# Patient Record
Sex: Female | Born: 1990 | Race: White | Hispanic: No | Marital: Single | Smoking: Current every day smoker
Health system: Southern US, Community
[De-identification: ages and names within clinical notes are randomized; demographics above are authoritative.]

## PROBLEM LIST (undated history)

## (undated) ENCOUNTER — Inpatient Hospital Stay (HOSPITAL_COMMUNITY): Payer: Self-pay

## (undated) DIAGNOSIS — M549 Dorsalgia, unspecified: Secondary | ICD-10-CM

## (undated) DIAGNOSIS — D649 Anemia, unspecified: Secondary | ICD-10-CM

## (undated) DIAGNOSIS — F419 Anxiety disorder, unspecified: Secondary | ICD-10-CM

## (undated) DIAGNOSIS — F329 Major depressive disorder, single episode, unspecified: Secondary | ICD-10-CM

## (undated) DIAGNOSIS — Z141 Cystic fibrosis carrier: Secondary | ICD-10-CM

## (undated) DIAGNOSIS — O9989 Other specified diseases and conditions complicating pregnancy, childbirth and the puerperium: Secondary | ICD-10-CM

## (undated) DIAGNOSIS — F32A Depression, unspecified: Secondary | ICD-10-CM

## (undated) HISTORY — DX: Anemia, unspecified: D64.9

## (undated) HISTORY — PX: APPENDECTOMY: SHX54

## (undated) HISTORY — DX: Other specified diseases and conditions complicating pregnancy, childbirth and the puerperium: O99.89

## (undated) HISTORY — DX: Dorsalgia, unspecified: M54.9

---
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
## 2011-03-29 ENCOUNTER — Emergency Department (HOSPITAL_COMMUNITY): Payer: Medicaid Other

## 2011-03-29 ENCOUNTER — Encounter (HOSPITAL_COMMUNITY): Payer: Self-pay

## 2011-03-29 ENCOUNTER — Emergency Department (HOSPITAL_COMMUNITY)
Admission: EM | Admit: 2011-03-29 | Discharge: 2011-03-29 | Disposition: A | Discharge status: Home / Self Care | Payer: Medicaid Other | Attending: Emergency Medicine | Admitting: Emergency Medicine

## 2011-03-29 DIAGNOSIS — A499 Bacterial infection, unspecified: Secondary | ICD-10-CM | POA: Insufficient documentation

## 2011-03-29 DIAGNOSIS — N939 Abnormal uterine and vaginal bleeding, unspecified: Secondary | ICD-10-CM

## 2011-03-29 DIAGNOSIS — F17200 Nicotine dependence, unspecified, uncomplicated: Secondary | ICD-10-CM | POA: Insufficient documentation

## 2011-03-29 DIAGNOSIS — R51 Headache: Secondary | ICD-10-CM | POA: Insufficient documentation

## 2011-03-29 DIAGNOSIS — N760 Acute vaginitis: Secondary | ICD-10-CM | POA: Insufficient documentation

## 2011-03-29 DIAGNOSIS — B9689 Other specified bacterial agents as the cause of diseases classified elsewhere: Secondary | ICD-10-CM | POA: Insufficient documentation

## 2011-03-29 DIAGNOSIS — F3289 Other specified depressive episodes: Secondary | ICD-10-CM | POA: Insufficient documentation

## 2011-03-29 DIAGNOSIS — F329 Major depressive disorder, single episode, unspecified: Secondary | ICD-10-CM | POA: Insufficient documentation

## 2011-03-29 DIAGNOSIS — N898 Other specified noninflammatory disorders of vagina: Secondary | ICD-10-CM | POA: Insufficient documentation

## 2011-03-29 LAB — DIFFERENTIAL
Basophils Absolute: 0 10*3/uL (ref 0.0–0.1)
Basophils Relative: 0 % (ref 0–1)
Eosinophils Absolute: 0.3 10*3/uL (ref 0.0–0.7)
Neutro Abs: 4.2 10*3/uL (ref 1.7–7.7)
Neutrophils Relative %: 55 % (ref 43–77)

## 2011-03-29 LAB — URINALYSIS, ROUTINE W REFLEX MICROSCOPIC
Glucose, UA: NEGATIVE mg/dL
Ketones, ur: NEGATIVE mg/dL
Leukocytes, UA: NEGATIVE
Protein, ur: NEGATIVE mg/dL
pH: 5.5 (ref 5.0–8.0)

## 2011-03-29 LAB — WET PREP, GENITAL
Trich, Wet Prep: NONE SEEN
Yeast Wet Prep HPF POC: NONE SEEN

## 2011-03-29 LAB — CBC
Hemoglobin: 13.4 g/dL (ref 12.0–15.0)
MCH: 31.3 pg (ref 26.0–34.0)
MCHC: 33.4 g/dL (ref 30.0–36.0)
Platelets: 259 10*3/uL (ref 150–400)
RDW: 12.6 % (ref 11.5–15.5)

## 2011-03-29 LAB — COMPREHENSIVE METABOLIC PANEL
AST: 15 U/L (ref 0–37)
Albumin: 4.3 g/dL (ref 3.5–5.2)
Alkaline Phosphatase: 85 U/L (ref 39–117)
BUN: 7 mg/dL (ref 6–23)
Chloride: 105 mEq/L (ref 96–112)
Potassium: 4.2 mEq/L (ref 3.5–5.1)
Sodium: 139 mEq/L (ref 135–145)
Total Bilirubin: 0.2 mg/dL — ABNORMAL LOW (ref 0.3–1.2)
Total Protein: 7.8 g/dL (ref 6.0–8.3)

## 2011-03-29 LAB — URINE MICROSCOPIC-ADD ON

## 2011-03-29 LAB — POCT PREGNANCY, URINE: Preg Test, Ur: NEGATIVE

## 2011-03-29 MED ORDER — NAPROXEN SODIUM 550 MG PO TABS: 550.0000 mg | ORAL_TABLET | Freq: Two times a day (BID) | ORAL | Status: DC

## 2011-03-29 MED ORDER — METRONIDAZOLE 500 MG PO TABS: 500.0000 mg | ORAL_TABLET | Freq: Two times a day (BID) | ORAL | Status: AC

## 2011-03-29 NOTE — ED Provider Notes (Signed)
Medical screening examination/treatment/procedure(s) were performed by non-physician practitioner and as supervising physician I was immediately available for consultation/collaboration.  Champagne Paletta S. Douglas Rooks, MD 03/29/11 2309 

## 2011-03-29 NOTE — ED Notes (Signed)
Pt c/o llq pain and diarrhea x 1 week.  Reports is 5 months postpartum.   Denies urinary symptoms, reports bled  From Nov 17th until last week.  Reports has had whitish colored discharge x 2 days.

## 2011-03-29 NOTE — ED Notes (Signed)
Returned from U/S

## 2011-03-29 NOTE — ED Provider Notes (Signed)
History     CSN: [id number]  Arrival date & time 03/29/11  1444   First MD Initiated Contact with Patient 03/29/11 1506      Chief Complaint  Patient presents with  . Abdominal Pain   HPI Jeanne Hernandez is a 21 y.o. female who presents to the ED for abdominal pain. The pain is located in the left lower quadrant and started one week ago. She is 5 months postpartum. Stopped bleeding one week ago.  She denies UTI symptoms. White vaginal discharge x 2 days. Denies nausea or vomiting but has had diarrhea past week with 2 stools per day that are loose or watery, brown. Depo Provera for birth control. Last sexual intercourse 03/14/11 with out pain or bleeding. Current sex partner x 4 years. The history was provided by the patient.   Past Medical History  Diagnosis Date  . Cystic fibrosis gene carrier   . Depression     Past Surgical History  Procedure Date  . Appendectomy     No family history on file.  History  Substance Use Topics  . Smoking status: Current Everyday Smoker -- 2.0 packs/day    Types: Cigarettes  . Smokeless tobacco: Not on file  . Alcohol Use: No    OB History    Grav Para Term Preterm Abortions TAB SAB Ect Mult Living   2 2 2       1       Review of Systems  Constitutional: Positive for fatigue. Negative for fever, chills and diaphoresis.  HENT: Negative for ear pain, congestion, sore throat, facial swelling, neck pain, neck stiffness, dental problem and sinus pressure.   Eyes: Negative for photophobia, pain and discharge.  Respiratory: Positive for wheezing. Negative for cough and chest tightness.   Cardiovascular: Negative.   Gastrointestinal: Positive for nausea, abdominal pain and diarrhea. Negative for vomiting, constipation and abdominal distention.  Genitourinary: Positive for vaginal discharge. Negative for dysuria, frequency, flank pain and difficulty urinating.  Musculoskeletal: Negative for myalgias, back pain and gait problem.  Skin: Negative for  color change and rash.  Neurological: Positive for headaches. Negative for dizziness, speech difficulty, weakness, light-headedness and numbness.  Psychiatric/Behavioral: Negative for confusion and agitation. The patient is not nervous/anxious.        Depression    Allergies  Review of patient's allergies indicates no known allergies.  Home Medications   Current Outpatient Rx  Name Route Sig Dispense Refill  . ESCITALOPRAM OXALATE 10 MG PO TABS Oral Take 10 mg by mouth at bedtime.     Marland Kitchen PRENATAL PLUS 27-1 MG PO TABS Oral Take 1 tablet by mouth daily.        BP 114/63  Pulse 78  Temp(Src) 98.5 F (36.9 C) (Oral)  Resp 20  Ht 5\' 2"  (1.575 m)  Wt 107 lb (48.535 kg)  BMI 19.57 kg/m2  SpO2 100%  Breastfeeding? No  Physical Exam  Nursing note and vitals reviewed. Constitutional: She is oriented to person, place, and time. She appears well-developed and well-nourished.  HENT:  Head: Normocephalic.  Eyes: EOM are normal.  Neck: Neck supple.  Cardiovascular: Normal rate.   Pulmonary/Chest: Effort normal.  Abdominal: Soft. There is no tenderness.  Genitourinary:       External genitalia without lesions. Scant blood vaginal vault. Cervix inflamed, mild CMT, left adnexal tenderness. Rectal exam, tone- normal, soft brown stool palpated, non tender on exam.  Musculoskeletal: Normal range of motion.  Neurological: She is alert and oriented to person,  place, and time. No cranial nerve deficit.  Skin: Skin is warm and dry.  Psychiatric: She has a normal mood and affect. Her behavior is normal. Judgment and thought content normal.   Results for orders placed during the hospital encounter of 03/29/11 (from the past 24 hour(s))  WET PREP, GENITAL     Status: Abnormal   Collection Time   03/29/11  3:25 PM      Component Value Range   Yeast, Wet Prep NONE SEEN  NONE SEEN    Trich, Wet Prep NONE SEEN  NONE SEEN    Clue Cells, Wet Prep MODERATE (*) NONE SEEN    WBC, Wet Prep HPF POC MANY  (*) NONE SEEN   URINALYSIS, ROUTINE W REFLEX MICROSCOPIC     Status: Abnormal   Collection Time   03/29/11  3:27 PM      Component Value Range   Color, Urine YELLOW  YELLOW    APPearance CLEAR  CLEAR    Specific Gravity, Urine >1.030 (*) 1.005 - 1.030    pH 5.5  5.0 - 8.0    Glucose, UA NEGATIVE  NEGATIVE (mg/dL)   Hgb urine dipstick TRACE (*) NEGATIVE    Bilirubin Urine NEGATIVE  NEGATIVE    Ketones, ur NEGATIVE  NEGATIVE (mg/dL)   Protein, ur NEGATIVE  NEGATIVE (mg/dL)   Urobilinogen, UA 0.2  0.0 - 1.0 (mg/dL)   Nitrite NEGATIVE  NEGATIVE    Leukocytes, UA NEGATIVE  NEGATIVE   URINE MICROSCOPIC-ADD ON     Status: Normal   Collection Time   03/29/11  3:27 PM      Component Value Range   Squamous Epithelial / LPF RARE  RARE    WBC, UA 0-2  <3 (WBC/hpf)   RBC / HPF 0-2  <3 (RBC/hpf)   Bacteria, UA RARE  RARE    Urine-Other MUCOUS PRESENT    POCT PREGNANCY, URINE     Status: Normal   Collection Time   03/29/11  3:30 PM      Component Value Range   Preg Test, Ur NEGATIVE    CBC     Status: Normal   Collection Time   03/29/11  3:38 PM      Component Value Range   WBC 7.7  4.0 - 10.5 (K/uL)   RBC 4.28  3.87 - 5.11 (MIL/uL)   Hemoglobin 13.4  12.0 - 15.0 (g/dL)   HCT 16.1  09.6 - 04.5 (%)   MCV 93.7  78.0 - 100.0 (fL)   MCH 31.3  26.0 - 34.0 (pg)   MCHC 33.4  30.0 - 36.0 (g/dL)   RDW 40.9  81.1 - 91.4 (%)   Platelets 259  150 - 400 (K/uL)  DIFFERENTIAL     Status: Normal   Collection Time   03/29/11  3:38 PM      Component Value Range   Neutrophils Relative 55  43 - 77 (%)   Neutro Abs 4.2  1.7 - 7.7 (K/uL)   Lymphocytes Relative 35  12 - 46 (%)   Lymphs Abs 2.7  0.7 - 4.0 (K/uL)   Monocytes Relative 7  3 - 12 (%)   Monocytes Absolute 0.5  0.1 - 1.0 (K/uL)   Eosinophils Relative 3  0 - 5 (%)   Eosinophils Absolute 0.3  0.0 - 0.7 (K/uL)   Basophils Relative 0  0 - 1 (%)   Basophils Absolute 0.0  0.0 - 0.1 (K/uL)   US Transvaginal Non-ob  03/29/2011  *  RADIOLOGY REPORT*   Clinical Data: Left adnexal pain  TRANSABDOMINAL AND TRANSVAGINAL ULTRASOUND OF PELVIS Technique:  Both transabdominal and transvaginal ultrasound examinations of the pelvis were performed. Transabdominal technique was performed for global imaging of the pelvis including uterus, ovaries, adnexal regions, and pelvic cul-de-sac.  Comparison: None.   It was necessary to proceed with endovaginal exam following the transabdominal exam to visualize the endometrium.  Findings:  Uterus: Normal in size and appearance, measuring 7.2 x 3.5 x 5.0 cm.  Endometrium: Normal in thickness and appearance, measuring 3 mm.  Right ovary:  Measures 4.4 x 2.2 x 2.4 cm and is notable for a 1.8 x 2.6 x 1.7 cm cyst.  Left ovary: Normal appearance/no adnexal mass, measuring 2.8 x 1.0 x 1.4 cm.  Other findings: Small volume pelvic ascites.  IMPRESSION: Normal study.  No evidence of pelvic mass or other significant abnormality.  Original Report Authenticated By: Charline Bills, M.D.   US Pelvis Complete  03/29/2011  *RADIOLOGY REPORT*  Clinical Data: Left adnexal pain  TRANSABDOMINAL AND TRANSVAGINAL ULTRASOUND OF PELVIS Technique:  Both transabdominal and transvaginal ultrasound examinations of the pelvis were performed. Transabdominal technique was performed for global imaging of the pelvis including uterus, ovaries, adnexal regions, and pelvic cul-de-sac.  Comparison: None.   It was necessary to proceed with endovaginal exam following the transabdominal exam to visualize the endometrium.  Findings:  Uterus: Normal in size and appearance, measuring 7.2 x 3.5 x 5.0 cm.  Endometrium: Normal in thickness and appearance, measuring 3 mm.  Right ovary:  Measures 4.4 x 2.2 x 2.4 cm and is notable for a 1.8 x 2.6 x 1.7 cm cyst.  Left ovary: Normal appearance/no adnexal mass, measuring 2.8 x 1.0 x 1.4 cm.  Other findings: Small volume pelvic ascites.  IMPRESSION: Normal study.  No evidence of pelvic mass or other significant abnormality.   Original Report Authenticated By: Charline Bills, M.D.   Assessment: Bacterial vaginosis   Abnormal vaginal bleeding probably due to Depo Provera  Plan:  Rx Flagyl   Follow up with [facility]   Return as needed  ED Course  Procedures   MDM          Kerrie Buffalo, NP 03/29/11 1639

## 2011-03-29 NOTE — ED Notes (Signed)
Patient transported to Ultrasound 

## 2011-03-29 NOTE — ED Notes (Signed)
Pain LLQ and suprapubic area for 2 days, Has been having diarrhea also,  White vag d/c.

## 2011-03-30 LAB — GC/CHLAMYDIA PROBE AMP, GENITAL
Chlamydia, DNA Probe: NEGATIVE
GC Probe Amp, Genital: NEGATIVE

## 2011-05-03 ENCOUNTER — Emergency Department (HOSPITAL_COMMUNITY)
Admission: EM | Admit: 2011-05-03 | Discharge: 2011-05-04 | Disposition: A | Discharge status: Other Acute Inpatient Hospital | Payer: 59 | Source: Home / Self Care | Attending: Emergency Medicine | Admitting: Emergency Medicine

## 2011-05-03 ENCOUNTER — Encounter (HOSPITAL_COMMUNITY): Payer: Self-pay | Admitting: Emergency Medicine

## 2011-05-03 DIAGNOSIS — R45851 Suicidal ideations: Secondary | ICD-10-CM

## 2011-05-03 DIAGNOSIS — F329 Major depressive disorder, single episode, unspecified: Secondary | ICD-10-CM | POA: Insufficient documentation

## 2011-05-03 DIAGNOSIS — F3289 Other specified depressive episodes: Secondary | ICD-10-CM | POA: Insufficient documentation

## 2011-05-03 DIAGNOSIS — F411 Generalized anxiety disorder: Secondary | ICD-10-CM | POA: Insufficient documentation

## 2011-05-03 DIAGNOSIS — Z141 Cystic fibrosis carrier: Secondary | ICD-10-CM | POA: Insufficient documentation

## 2011-05-03 HISTORY — DX: Anxiety disorder, unspecified: F41.9

## 2011-05-03 LAB — RAPID URINE DRUG SCREEN, HOSP PERFORMED
Amphetamines: NOT DETECTED
Barbiturates: NOT DETECTED
Benzodiazepines: NOT DETECTED
Cocaine: NOT DETECTED

## 2011-05-03 LAB — CBC
HCT: 38.4 % (ref 36.0–46.0)
MCHC: 34.4 g/dL (ref 30.0–36.0)
MCV: 94.6 fL (ref 78.0–100.0)
Platelets: 273 10*3/uL (ref 150–400)
RDW: 13 % (ref 11.5–15.5)

## 2011-05-03 LAB — URINALYSIS, ROUTINE W REFLEX MICROSCOPIC
Glucose, UA: NEGATIVE mg/dL
Ketones, ur: NEGATIVE mg/dL
pH: 5.5 (ref 5.0–8.0)

## 2011-05-03 LAB — BASIC METABOLIC PANEL
Calcium: 9.8 mg/dL (ref 8.4–10.5)
Creatinine, Ser: 0.8 mg/dL (ref 0.50–1.10)
GFR calc Af Amer: >90 mL/min (ref >90)
GFR calc non Af Amer: >90 mL/min (ref >90)

## 2011-05-03 LAB — DIFFERENTIAL
Basophils Absolute: 0 10*3/uL (ref 0.0–0.1)
Basophils Relative: 0 % (ref 0–1)
Eosinophils Relative: 2 % (ref 0–5)
Monocytes Absolute: 0.6 10*3/uL (ref 0.1–1.0)
Neutro Abs: 5.2 10*3/uL (ref 1.7–7.7)

## 2011-05-03 LAB — POCT PREGNANCY, URINE: Preg Test, Ur: NEGATIVE

## 2011-05-03 NOTE — ED Provider Notes (Signed)
History     CSN: [id number]  Arrival date & time 05/03/11  1916   First MD Initiated Contact with Patient 05/03/11 2009        (Consider location/radiation/quality/duration/timing/severity/associated sxs/prior treatment) The history is provided by the patient.   patient presents with suicidal ideations without definitive plan. No syncope stressed due to home situation. Denies any intentional ingestions. No auditory or visual hallucinations. No prior suicide attempt in the past. History of depression and anxiety and doesn't medications for this. No extra medications taken prior to arrival and stress makes them worse  Past Medical History  Diagnosis Date  . Cystic fibrosis gene carrier   . Depression   . Anxiety     Past Surgical History  Procedure Date  . Appendectomy     No family history on file.  History  Substance Use Topics  . Smoking status: Current Everyday Smoker -- 2.0 packs/day    Types: Cigarettes  . Smokeless tobacco: Not on file  . Alcohol Use: No    OB History    Grav Para Term Preterm Abortions TAB SAB Ect Mult Living   2 2 2       1       Review of Systems  All other systems reviewed and are negative.    Allergies  Review of patient's allergies indicates no known allergies.  Home Medications   Current Outpatient Rx  Name Route Sig Dispense Refill  . ARIPIPRAZOLE 5 MG PO TABS Oral Take 5 mg by mouth daily.    Marland Kitchen ESCITALOPRAM OXALATE 20 MG PO TABS Oral Take 20 mg by mouth at bedtime.    Marland Kitchen NAPROXEN SODIUM 550 MG PO TABS Oral Take 1 tablet (550 mg total) by mouth 2 (two) times daily with a meal. 15 tablet 0    BP 129/79  Pulse 71  Temp(Src) 98.3 F (36.8 C) (Oral)  Resp 18  Ht 5' 2.5" (1.588 m)  Wt 110 lb (49.896 kg)  BMI 19.80 kg/m2  SpO2 98%  Physical Exam  Nursing note and vitals reviewed. Constitutional: She is oriented to person, place, and time. Vital signs are normal. She appears well-developed and well-nourished.  Non-toxic  appearance. No distress.  HENT:  Head: Normocephalic and atraumatic.  Eyes: Conjunctivae, EOM and lids are normal. Pupils are equal, round, and reactive to light.  Neck: Normal range of motion. Neck supple. No tracheal deviation present. No mass present.  Cardiovascular: Normal rate, regular rhythm and normal heart sounds.  Exam reveals no gallop.   No murmur heard. Pulmonary/Chest: Effort normal and breath sounds normal. No stridor. No respiratory distress. She has no decreased breath sounds. She has no wheezes. She has no rhonchi. She has no rales.  Abdominal: Soft. Normal appearance and bowel sounds are normal. She exhibits no distension. There is no tenderness. There is no rebound and no CVA tenderness.  Musculoskeletal: Normal range of motion. She exhibits no edema and no tenderness.  Neurological: She is alert and oriented to person, place, and time. She has normal strength. No cranial nerve deficit or sensory deficit. GCS eye subscore is 4. GCS verbal subscore is 5. GCS motor subscore is 6.  Skin: Skin is warm and dry. No abrasion and no rash noted.  Psychiatric: Her speech is normal and behavior is normal. Her affect is blunt. Thought content is not paranoid and not delusional. She expresses suicidal ideation. She expresses no suicidal plans.    ED Course  Procedures (including critical care time)  Labs Reviewed  URINALYSIS, ROUTINE W REFLEX MICROSCOPIC  ETHANOL  URINE RAPID DRUG SCREEN (HOSP PERFORMED)  CBC  DIFFERENTIAL  BASIC METABOLIC PANEL   No results found.   No diagnosis found.    MDM  Spoke with act team, and they will come to see and dispo        Toy Baker, MD 05/03/11 2029

## 2011-05-03 NOTE — ED Notes (Signed)
Returned from consult room - given warm blankets.

## 2011-05-03 NOTE — ED Notes (Signed)
Patient with c/o SI that started several hours ago. Patient reports history of depression. Reports stressor at home. Calm/cooperative at present. Tearful in triage.
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
## 2011-05-03 NOTE — ED Notes (Signed)
ACT Consultant - Samson Frederic talking with patient

## 2011-05-04 ENCOUNTER — Encounter (HOSPITAL_COMMUNITY): Payer: Self-pay | Admitting: *Deleted

## 2011-05-04 ENCOUNTER — Inpatient Hospital Stay (HOSPITAL_COMMUNITY)
Admission: AD | Admit: 2011-05-04 | Discharge: 2011-05-05 | DRG: 885 | Disposition: A | Discharge status: Home / Self Care | Payer: 59 | Source: Ambulatory Visit | Attending: Psychiatry | Admitting: Psychiatry

## 2011-05-04 DIAGNOSIS — F3130 Bipolar disorder, current episode depressed, mild or moderate severity, unspecified: Principal | ICD-10-CM

## 2011-05-04 DIAGNOSIS — F411 Generalized anxiety disorder: Secondary | ICD-10-CM

## 2011-05-04 DIAGNOSIS — R45851 Suicidal ideations: Secondary | ICD-10-CM

## 2011-05-04 DIAGNOSIS — F17200 Nicotine dependence, unspecified, uncomplicated: Secondary | ICD-10-CM

## 2011-05-04 DIAGNOSIS — Z141 Cystic fibrosis carrier: Secondary | ICD-10-CM

## 2011-05-04 LAB — URINALYSIS, ROUTINE W REFLEX MICROSCOPIC
Bilirubin Urine: NEGATIVE
Glucose, UA: NEGATIVE mg/dL
Nitrite: NEGATIVE
Specific Gravity, Urine: 1.025 (ref 1.005–1.030)
pH: 5.5 (ref 5.0–8.0)

## 2011-05-04 LAB — URINE MICROSCOPIC-ADD ON

## 2011-05-04 MED ORDER — MAGNESIUM HYDROXIDE 400 MG/5ML PO SUSP: 30.0000 mL | Freq: Every day | ORAL | Status: DC | PRN

## 2011-05-04 MED ORDER — ESCITALOPRAM OXALATE 20 MG PO TABS
20.0000 mg | ORAL_TABLET | Freq: Every day | ORAL | Status: DC
  Administered 2011-05-04: 20 mg via ORAL
  Filled 2011-05-04 (×2): qty 1

## 2011-05-04 MED ORDER — IBUPROFEN 600 MG PO TABS
600.0000 mg | ORAL_TABLET | Freq: Four times a day (QID) | ORAL | Status: DC | PRN
  Administered 2011-05-04 – 2011-05-05 (×4): 600 mg via ORAL
  Filled 2011-05-04 (×4): qty 1

## 2011-05-04 MED ORDER — PROPRANOLOL HCL ER 60 MG PO CP24
60.0000 mg | ORAL_CAPSULE | Freq: Every day | ORAL | Status: DC
  Administered 2011-05-04 – 2011-05-05 (×2): 60 mg via ORAL
  Filled 2011-05-04 (×7): qty 1

## 2011-05-04 MED ORDER — VENLAFAXINE HCL ER 37.5 MG PO CP24
37.5000 mg | ORAL_CAPSULE | Freq: Every day | ORAL | Status: DC
  Administered 2011-05-05: 37.5 mg via ORAL
  Filled 2011-05-04 (×2): qty 1

## 2011-05-04 MED ORDER — NICOTINE 21 MG/24HR TD PT24
21.0000 mg | MEDICATED_PATCH | Freq: Every day | TRANSDERMAL | Status: DC
  Administered 2011-05-04 – 2011-05-05 (×3): 21 mg via TRANSDERMAL
  Filled 2011-05-04 (×3): qty 1

## 2011-05-04 MED ORDER — LAMOTRIGINE 5 MG PO CHEW: 25.0000 mg | CHEWABLE_TABLET | Freq: Every day | ORAL | Status: DC

## 2011-05-04 MED ORDER — BUPROPION HCL ER (SR) 100 MG PO TB12: 100.0000 mg | ORAL_TABLET | Freq: Two times a day (BID) | ORAL | Status: DC

## 2011-05-04 MED ORDER — LAMOTRIGINE 25 MG PO TABS
25.0000 mg | ORAL_TABLET | Freq: Every day | ORAL | Status: DC
  Administered 2011-05-04 – 2011-05-05 (×2): 25 mg via ORAL
  Filled 2011-05-04 (×4): qty 1

## 2011-05-04 MED ORDER — ALUM & MAG HYDROXIDE-SIMETH 200-200-20 MG/5ML PO SUSP: 30.0000 mL | ORAL | Status: DC | PRN

## 2011-05-04 MED ORDER — ACETAMINOPHEN 325 MG PO TABS
650.0000 mg | ORAL_TABLET | Freq: Four times a day (QID) | ORAL | Status: DC | PRN
  Administered 2011-05-04: 650 mg via ORAL

## 2011-05-04 NOTE — [facility] Suicide Risk Assessment (Signed)
Suicide Risk Assessment  Discharge Assessment     Demographic factors:  Assessment Details Time of Assessment: Admission Information Obtained From: Patient Current Mental Status:  Current Mental Status:  (denied SI & HI, contracts for safety, appropriate) Risk Reduction Factors:  Risk Reduction Factors: Responsible for children under 20 years of age;Sense of responsibility to family;Living with another person, especially a relative;Positive social support  CLINICAL FACTORS:   Severe Anxiety and/or Agitation Bipolar Disorder:   Bipolar II Depression:   Anhedonia Hopelessness Previous Psychiatric Diagnoses and Treatments  COGNITIVE FEATURES THAT CONTRIBUTE TO RISK:  Thought constriction (tunnel vision)    SUICIDE RISK:   Moderate:  Frequent suicidal ideation with limited intensity, and duration, some specificity in terms of plans, no associated intent, good self-control, limited dysphoria/symptomatology, some risk factors present, and identifiable protective factors, including available and accessible social support.  Reason for hospitalization: .Thoughts of self harm  Diagnosis:  Axis I: Bipolar, Depressed  ADL's:  Intact  Sleep: Poor  Appetite:  Fair  Suicidal Ideation:  Pt denies any thoughts of self harm now. Homicidal Ideation:  Denies adamantly any homicidal thoughts.  Mental Status Examination/Evaluation: Objective:  Appearance: Casual  Eye Contact::  Good  Speech:  Clear and Coherent  Volume:  Normal  Mood: 3 or 4 /10 on a scale of 1 is the best and 10 is the worst  Anxiety: 6/10 on the same scale  Affect:  Congruent  Thought Process:  Coherent  Orientation:  Full  Thought Content:  WDL  Suicidal Thoughts:  No  Homicidal Thoughts:  No  Memory:  Immediate;   Fair  Judgement:  Fair  Insight:  Fair  Psychomotor Activity:  Normal  Concentration:  Fair  Recall:  Fair  Akathisia:  No  Handed:  Right  AIMS (if indicated):     Assets:  Communication  Skills Desire for Improvement Financial Resources/Insurance Housing Intimacy Leisure Time Social Support  Sleep:       Vital Signs: Blood pressure 108/68, pulse 67, temperature 98.1 F (36.7 C), temperature source Oral, resp. rate 18, height 5\' 2"  (1.575 m), weight 46.267 kg (102 lb). Current Medications:  Current Facility-Administered Medications  Medication Dose Route Frequency Provider Last Rate Last Dose  . acetaminophen (TYLENOL) tablet 650 mg  650 mg Oral Q6H PRN Jorje Guild, PA   650 mg at 05/04/11 1300  . alum & mag hydroxide-simeth (MAALOX/MYLANTA) 200-200-20 MG/5ML suspension 30 mL  30 mL Oral Q4H PRN Jorje Guild, PA      . magnesium hydroxide (MILK OF MAGNESIA) suspension 30 mL  30 mL Oral Daily PRN Jorje Guild, PA      . nicotine (NICODERM CQ - dosed in mg/24 hours) patch 21 mg  21 mg Transdermal Daily Orson Aloe, MD   21 mg at 05/04/11 1318  . venlafaxine (EFFEXOR-XR) 24 hr capsule 37.5 mg  37.5 mg Oral Q breakfast Orson Aloe, MD      . DISCONTD: buPROPion [facility] SR) 12 hr tablet 100 mg  100 mg Oral BID Orson Aloe, MD      . DISCONTD: escitalopram (LEXAPRO) tablet 20 mg  20 mg Oral Daily Jorje Guild, PA   20 mg at 05/04/11 1610    Lab Results:  Results for orders placed during the hospital encounter of 05/03/11 (from the past 48 hour(s))  URINALYSIS, ROUTINE W REFLEX MICROSCOPIC     Status: Abnormal   Collection Time   05/03/11  8:16 PM      Component Value Range  Comment   Color, Urine YELLOW  YELLOW     APPearance CLEAR  CLEAR     Specific Gravity, Urine 1.025  1.005 - 1.030     pH 5.5  5.0 - 8.0     Glucose, UA NEGATIVE  NEGATIVE (mg/dL)    Hgb urine dipstick LARGE (*) NEGATIVE     Bilirubin Urine NEGATIVE  NEGATIVE     Ketones, ur NEGATIVE  NEGATIVE (mg/dL)    Protein, ur NEGATIVE  NEGATIVE (mg/dL)    Urobilinogen, UA 0.2  0.0 - 1.0 (mg/dL)    Nitrite NEGATIVE  NEGATIVE     Leukocytes, UA SMALL (*) NEGATIVE    URINE RAPID DRUG SCREEN (HOSP PERFORMED)      Status: Abnormal   Collection Time   05/03/11  8:16 PM      Component Value Range Comment   Opiates NONE DETECTED  NONE DETECTED     Cocaine NONE DETECTED  NONE DETECTED     Benzodiazepines NONE DETECTED  NONE DETECTED     Amphetamines NONE DETECTED  NONE DETECTED     Tetrahydrocannabinol POSITIVE (*) NONE DETECTED     Barbiturates NONE DETECTED  NONE DETECTED    URINE MICROSCOPIC-ADD ON     Status: Abnormal   Collection Time   05/03/11  8:16 PM      Component Value Range Comment   Squamous Epithelial / LPF MANY (*) RARE     WBC, UA 11-20  <3 (WBC/hpf)    RBC / HPF 21-50  <3 (RBC/hpf)    Bacteria, UA MANY (*) RARE    ETHANOL     Status: Normal   Collection Time   05/03/11  8:20 PM      Component Value Range Comment   Alcohol, Ethyl (B) <11  0 - 11 (mg/dL)   CBC     Status: Normal   Collection Time   05/03/11  8:20 PM      Component Value Range Comment   WBC 9.7  4.0 - 10.5 (K/uL)    RBC 4.06  3.87 - 5.11 (MIL/uL)    Hemoglobin 13.2  12.0 - 15.0 (g/dL)    HCT 16.1  09.6 - 04.5 (%)    MCV 94.6  78.0 - 100.0 (fL)    MCH 32.5  26.0 - 34.0 (pg)    MCHC 34.4  30.0 - 36.0 (g/dL)    RDW 40.9  81.1 - 91.4 (%)    Platelets 273  150 - 400 (K/uL)   DIFFERENTIAL     Status: Normal   Collection Time   05/03/11  8:20 PM      Component Value Range Comment   Neutrophils Relative 53  43 - 77 (%)    Neutro Abs 5.2  1.7 - 7.7 (K/uL)    Lymphocytes Relative 39  12 - 46 (%)    Lymphs Abs 3.7  0.7 - 4.0 (K/uL)    Monocytes Relative 6  3 - 12 (%)    Monocytes Absolute 0.6  0.1 - 1.0 (K/uL)    Eosinophils Relative 2  0 - 5 (%)    Eosinophils Absolute 0.2  0.0 - 0.7 (K/uL)    Basophils Relative 0  0 - 1 (%)    Basophils Absolute 0.0  0.0 - 0.1 (K/uL)   BASIC METABOLIC PANEL     Status: Abnormal   Collection Time   05/03/11  8:20 PM      Component Value Range Comment   Sodium 138  135 - 145 (mEq/L)    Potassium 3.5  3.5 - 5.1 (mEq/L)    Chloride 104  96 - 112 (mEq/L)    CO2 23  19 - 32  (mEq/L)    Glucose, Bld 103 (*) 70 - 99 (mg/dL)    BUN 7  6 - 23 (mg/dL)    Creatinine, Ser 1.61  0.50 - 1.10 (mg/dL)    Calcium 9.8  8.4 - 10.5 (mg/dL)    GFR calc non Af Amer >90  >90 (mL/min)    GFR calc Af Amer >90  >90 (mL/min)   POCT PREGNANCY, URINE     Status: Normal   Collection Time   05/03/11  8:22 PM      Component Value Range Comment   Preg Test, Ur NEGATIVE  NEGATIVE     Physical Findings: AIMS:   CIWA:   CIWA-Ar Total: 4  COWS:      Treatment Plan Summary: Daily contact with patient to assess and evaluate symptoms and progress in treatment Medication management  Risk of harm to self is elevated by her depression and possible bipolar disorder  Risk of harm to others is minimal in that she has not been involved in fights or had any legal charges filed on her.   Plan: We will admit the patient for crisis stabilization and treatment. I talked to pt about starting Lamictal for depression and mood dysregulation and irritability and Effexor for depression and focus problems I explained the risks and benefits of medication in detail.  We will continue on q. 15 checks the unit protocol. At this time there is no clinical indication for one-to-one observation as patient contract for safety and presents little risk to harm themself and others.  We will increase collateral information. I encourage patient to participate in group milieu therapy. Pt will be seen in treatment team meeting tomorrow morning for further treatment and appropriate discharge planning. Please see history and physical note for more detailed information ELOS: 3 to 5 days.   Jeanne Hernandez 05/04/2011, 1:48 PM
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
## 2011-05-04 NOTE — Progress Notes (Signed)
[facility] Group Notes:  (Counselor/Nursing/MHT/Case Management/Adjunct)  05/04/2011 12:33 PM  Type of Therapy:  Group Therapy  Participation Level:  Did Not Attend  Jeanne Hernandez 05/04/2011, 12:33 PM  Cosigned by: Angus Palms, LCSW
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
## 2011-05-04 NOTE — Progress Notes (Addendum)
Patient ID: Jeanne Hernandez, female   DOB: 06-07-1990, 21 y.o.   MRN: [id number] Left foot/heel pain approximately 1 year, stated she has been told MD that fluid is inside her heel.  Medicated with tylenol.   Will discuss with MD. Patient stated her left heel/ft was feeling much better.   Then within a few minutes stated her left heel/ft was hurting again and asked for ibuprofen. On self inventory form, sleeps fair, good appetite, low energy level, good attention span.   Rated depression and hopelessness #10.  Does have thoughts of hurting self, contracts for safety.  Has felt dizzy and had headache, blurred vision in past 24 hours.  Plans to talk to someone, go to a room, sit and calm down when needed.   No questions for staff.   Not sure of discharge plans.  No problems taking meds after discharge.

## 2011-05-04 NOTE — H&P (Signed)
Psychiatric Admission Assessment Adult  Patient Identification:  Jeanne Hernandez Date of Evaluation:  05/04/2011 Chief Complaint:  MDD  History of Present Illness:: Jeanne Hernandez is a 21 year old Caucasian female, admitted to [facility] from the [facility] with complaint of suicidal ideation. Patient reports, "My boyfriend and I had 2 children together. We are involved with DSS in a child custody case right now. I am scheduled to have my son back in May of 2013. My son is currently living with my father. And I have also a 48 month old child. My boyfriend recently told me that it is time we start seeing other people. That upset me because I have been through a lot with him. I could not deal with the thought of having to start over, that is why I started thinking about suicide. But I did not plan or try to commit suicide"  Mood Symptoms:  Mood Swings, Sadness, SI, Worthlessness, Depression Symptoms:  depressed mood, (Hypo) Manic Symptoms:  Irritable Mood, Anxiety Symptoms:  Excessive Worry, Psychotic Symptoms:  Hallucinations: None  PTSD Symptoms: Had a traumatic exposure:  None reported  Past Psychiatric History: Diagnosis: Bipolar disorder, mixed  Hospitalizations: [facility]  Outpatient Care: [facility] with [facility]age  Substance Abuse Care: None reported  Self-Mutilation: Denies report  Suicidal Attempts: Denies attempt, admits thoughts.  Violent Behaviors: None reported   Past Medical History:   Past Medical History  Diagnosis Date  . Cystic fibrosis gene carrier   . Depression   . Anxiety    Loss of Consciousness:  None reported Seizure History:  None reported Cardiac History:  None reported Traumatic Brain Injury:  None reported Allergies:  No Known Allergies PTA Medications: Prescriptions prior to admission  Medication Sig Dispense Refill  . ARIPiprazole (ABILIFY) 5 MG tablet Take 5 mg by mouth daily.      Marland Kitchen escitalopram (LEXAPRO) 20 MG tablet Take 20 mg by mouth daily.          Previous Psychotropic Medications:  Medication/Dose  Abilify 5 mg  Lexapro 20 mg  Propanolol 10 mg tid  Ibuprofen 600 mg         Substance Abuse History in the last 12 months: Substance Age of 1st Use Last Use Amount Specific Type  Nicotine 15 Prior to hosp 1/2 pack daily Cigarettes  Alcohol "I do not do drugs, use alcohol or anything else"     Cannabis      Opiates      Cocaine      Methamphetamines      LSD      Ecstasy      Benzodiazepines      Caffeine      Inhalants      Others:                         Consequences of Substance Abuse: Medical Consequences:  Liver damage, Possible death by overdose. Legal Consequences:  Arrests, jail time, loss of driving privilege Family Consequences:  Family discord Withdrawal Symptoms:   None  Social History: Current Place of Residence:  [place], [place] Place of Birth: [place], [place] Family Members: "My 2 children" Marital Status:  Single Children: 2   Relationships: "With my 2 children" Education:  No high school diploma Educational Problems/Performance: "I did not finish high school" Religious Beliefs/Practices: None reported History of Abuse (Emotional/Phsycial/Sexual): None reported Occupational Experiences: Unemployed Military History:  None. Legal History: "I have a case with DSS  for custody of my child" Hobbies/Interests: None reported  Family History:  No family history on file.  Mental Status Examination/Evaluation: Objective:  Appearance: Casual  Eye Contact::  Good  Speech:  Clear and Coherent  Volume:  Normal  Mood:  Depressed and Irritable  Affect:  Flat  Thought Process:  Coherent  Orientation:  Full  Thought Content:  Rumination  Suicidal Thoughts:  Yes.  without intent/plan  Homicidal Thoughts:  No  Memory:  Immediate;   Good Recent;   Good Remote;   Good  Judgement:  Fair  Insight:  Fair  Psychomotor Activity:  Normal  Concentration:  Fair  Recall:  Good  Akathisia:  No  Handed:  Right    AIMS (if indicated):     Assets:  Desire for Improvement  Sleep:       Laboratory/X-Ray: None Psychological Evaluation(s)      Assessment:    AXIS I:  Bipolar, Depressed AXIS II:  Deferred AXIS III:   Past Medical History  Diagnosis Date  . Cystic fibrosis gene carrier   . Depression   . Anxiety    AXIS IV:  economic problems, educational problems, occupational problems, other psychosocial or environmental problems and problems related to social environment AXIS V:  41-50 serious symptoms  Treatment Plan/Recommendations: Admit for safety and stabilization.                                                                Review and reinstate any pertinent home medications.                                                                  Treatment Plan Summary: Daily contact with patient to assess and evaluate symptoms and progress in treatment Medication management Current Medications:  Current Facility-Administered Medications  Medication Dose Route Frequency Provider Last Rate Last Dose  . acetaminophen (TYLENOL) tablet 650 mg  650 mg Oral Q6H PRN Jorje Guild, PA      . alum & mag hydroxide-simeth (MAALOX/MYLANTA) 200-200-20 MG/5ML suspension 30 mL  30 mL Oral Q4H PRN Jorje Guild, PA      . magnesium hydroxide (MILK OF MAGNESIA) suspension 30 mL  30 mL Oral Daily PRN Jorje Guild, PA      . venlafaxine (EFFEXOR-XR) 24 hr capsule 37.5 mg  37.5 mg Oral Q breakfast Orson Aloe, MD      . DISCONTD: buPROPion [facility] SR) 12 hr tablet 100 mg  100 mg Oral BID Orson Aloe, MD      . DISCONTD: escitalopram (LEXAPRO) tablet 20 mg  20 mg Oral Daily Jorje Guild, PA   20 mg at 05/04/11 4098    Observation Level/Precautions:  Q 15 minutes checks for safety  Laboratory:  Reviewed and noted ED lab findings on file  Psychotherapy:  Group  Medications:  See lists  Routine PRN Medications:  Yes  Consultations:  None indicated  Discharge Concerns:  Safety  Other:     Sanjuana Kava 2/28/201310:46 AM
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
## 2011-05-04 NOTE — Progress Notes (Signed)
[facility] Group Notes:  (Counselor/Nursing/MHT/Case Management/Adjunct)  05/04/2011 3:04 PM  Type of Therapy:  1:15PM Group Therapy  Participation Level:  Did Not Attend  Jeanne Hernandez 05/04/2011, 3:04 PM  Cosigned by: Angus Palms, LCSW

## 2011-05-05 MED ORDER — LAMOTRIGINE 25 MG PO TABS: 25.0000 mg | ORAL_TABLET | Freq: Every day | ORAL | Status: DC

## 2011-05-05 MED ORDER — VENLAFAXINE HCL ER 37.5 MG PO CP24: 37.5000 mg | ORAL_CAPSULE | Freq: Every day | ORAL | Status: DC

## 2011-05-05 MED ORDER — ARIPIPRAZOLE 5 MG PO TABS: 5.0000 mg | ORAL_TABLET | Freq: Every day | ORAL | Status: DC

## 2011-05-05 MED ORDER — PROPRANOLOL HCL ER 60 MG PO CP24: 60.0000 mg | ORAL_CAPSULE | Freq: Every day | ORAL | Status: DC
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
## 2011-05-05 NOTE — Progress Notes (Signed)
[facility] Group Notes:  (Counselor/Nursing/MHT/Case Management/Adjunct)  05/05/2011 5:58 PM  Type of Therapy:  Psychoeducational Skills  Participation Level:  Minimal  Participation Quality:  Appropriate  Affect:  Depressed  Cognitive:  Appropriate  Insight:  Limited  Engagement in Group:  Limited  Engagement in Therapy:  Limited  Modes of Intervention:  Education and Support  Summary of Progress/Problems:  Pt participated in group focused on educating Patients on the support groups offered by the [employer].  Pt listened attentively to the speaker and asked appropriate questions.  Intervention effective.   Arafat Cocuzza C 05/05/2011, 5:58 PM  

## 2011-05-05 NOTE — Progress Notes (Signed)
[facility] Group Notes:  (Counselor/Nursing/MHT/Case Management/Adjunct)  05/05/2011 5:27 PM   Type of Therapy:  Group Therapy  Participation Level:  Minimal  Participation Quality:  Appropriate  Affect:  Depressed  Cognitive: Oriented, Alert  Insight:  Limited  Engagement in Group:  Limited  Engagement in Therapy:  Limited  Modes of Intervention:  Clarification, Limit-setting, Problem-solving, Socialization and Support  Summary of Progress/Problems: Pt minimally participated in group by listening attentively and openly disclosing. Therapist prompted Pt to explain what progress is being made with identified goals.  Pt addressed pertinent issues.  Therapist prompted Pt to identify behaviors that needed to be changed and activities that need to be included or excluded.  Pt stated that she is trying to be a good mom and do what is expected in order to retain custody of her 2 children.  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 Pt took part in the affirmations activity and responded well to  positive feedback.  Some progress noted.  Intervention Effective.     Christen Butter 05/05/2011, 5:27 PM

## 2011-05-05 NOTE — Progress Notes (Signed)
[facility] Family/Significant Other Suicide Prevention Education  Suicide Prevention Education:  Education Completed;  Tedd Sias, Father [phone] has been identified by the patient as the family member/significant other who will aid the patient in the event of a mental health crisis (suicidal ideations/suicide attempt).  With written consent from the patient, the family member/significant other has been provided the following suicide prevention education, prior to the and/or following the discharge of the patient.  The suicide prevention education provided includes the following:  Suicide risk factors  Suicide prevention and interventions  National Suicide Hotline telephone number  [facility] assessment telephone number  [facility] Emergency Assistance 911  [facility] and/or Residential Mobile Crisis Unit telephone number  Request made of family/significant other to:  Remove weapons (e.g., guns, rifles, knives), all items previously/currently identified as safety concern.    Remove drugs/medications (over-the-counter, prescriptions, illicit drugs), all items previously/currently identified as a safety concern.  The family member/significant other verbalizes understanding of the suicide prevention education information provided.  The family member/significant other agrees to remove the items of safety concern listed above.  Christen Butter 05/05/2011, 4:17 PM

## 2011-05-05 NOTE — H&P (Signed)
Medical/psychiatric screening examination/treatment/procedure(s) were performed by non-physician practitioner and as supervising physician I was immediately available for consultation/collaboration.   I have seen and examined this patient and agree with this evaluation.  

## 2011-05-05 NOTE — Progress Notes (Signed)
Recreation Therapy Notes  05/05/2011         Time: 1415      Group Topic/Focus: The focus of this group is on emphasizing the importance of taking responsibility for one's actions.    Participation Level: Minimal  Participation Quality: Attentive  Affect: Anxious  Cognitive: Oriented   Additional Comments: patient missed much of group as she was preparing for discharge.   Lateshia Schmoker 05/05/2011 3:49 PM

## 2011-05-05 NOTE — Progress Notes (Signed)
Pt attended karaoke this evening. Pt has been observed interacting appropriately within the unit. Pt has been complaining of an headache with tension in her orbital area. Pt requested ibuprofen that wasn't due at the time of her request. Instead pt was encouraged to use a cold wash cloth to help soothe the pain. At 10:10 pt received a prn dose of Ibuprofen. Pt was asleep at follow-up. Pt is denying any SI at this time. Support and availability as needed has been extended to this pt. Pt safety remains with q27min checks.

## 2011-05-05 NOTE — Progress Notes (Signed)
[facility] Case Management Discharge Plan:  Will you be returning to the same living situation after discharge: Yes,  [place] to discharge home with husband At discharge, do you have transportation home?:Yes,  Family to transport home Do you have the ability to pay for your medications:  Patient has Medicaid Interagency Information:     Release of information consent forms completed and in the chart;  Patient's signature needed at discharge.  Patient to Follow up at:  Follow-up Information    Follow up with Dr. Tiburcio Pea on 05/09/2011. (You are scheduled to see Dr. Tiburcio Pea on Tuesday, May 09, 2011 at 5:00 p.m.)    Contact information:   [address] [place], [place]  [address] [phone]      Follow up with Zada Girt.   Contact information:   [facility]         Patient denies SI/HI:   Yes,  Patient is not endorsing [employer]ch[employer], retail and Suicide Prevention discussed:  Yes,  Reviewed during after care group  Barrier to discharge identified:No.  Summary and Recommendations:  Follow up with outpatient referrals  Wynn Banker 05/05/2011, 10:55 AM

## 2011-05-05 NOTE — Progress Notes (Signed)
Pt is blunted and anxious. She reports resting often since admission due to taking care of small children and plans on attending groups today. Gave medication as ordered and encouraged pt to increase fluids. Safety maintained on unit.

## 2011-05-09 NOTE — Discharge Summary (Signed)
Physician Discharge Summary Note  Patient:  Jeanne Hernandez is an 21 y.o., female MRN:  [id number] DOB:  1990-05-26 Patient phone:  [phone] (home)  Patient address:   [address] [place] [address],   Date of Admission:  05/04/2011 Date of Discharge: 05/05/2011  Reason for Admission: Jeanne Hernandez is a 21 year old Caucasian female, admitted to [facility] from the [facility] with complaint of suicidal ideation. Patient reports, "My boyfriend and I had 2 children together. We are involved with DSS in a child custody case right now. I am scheduled to have my son back in May of 2013. My son is currently living with my father. And I have also a 51 month old child. My boyfriend recently told me that it is time we start seeing other people. That upset me because I have been through a lot with him. I could not deal with the thought of having to start over, that is why I started thinking about suicide. But I did not plan or try to commit suicide"  Discharge Diagnoses: Principal Problem:  *Bipolar disorder current episode depressed   Axis Diagnosis:   AXIS I:  Bipolar, Depressed AXIS II:  Deferred AXIS III:   Past Medical History  Diagnosis Date  . Cystic fibrosis gene carrier   . Depression   . Anxiety    AXIS IV:  other psychosocial or environmental problems AXIS V:  51-60 moderate symptoms  Level of Care:  OP  Hospital Course:   Pt was admitted for stabilization.  She was placed on Lamictal and Effexor for her depressed bipolar mood.  She noted no side effects from being started on these two new medications.  She was also on Abilify for depression.  She agreed to follow-up with her out patient provider after discharge.  She seemed to learn some coping skills from the groups in the hospital.   Consults:  None  Significant Diagnostic Studies:  None  Discharge Vitals:   Blood pressure 98/62, pulse 61, temperature 98.5 F (36.9 C), temperature source Oral, resp. rate 16, height 5\' 2"   (1.575 m), weight 46.267 kg (102 lb).  Mental Status Exam: See Mental Status Examination and Suicide Risk Assessment completed by Attending Physician prior to discharge.  Discharge destination:  Home  Is patient on multiple antipsychotic therapies at discharge:  No   Has Patient had three or more failed trials of antipsychotic monotherapy by history:  NA  Recommended Plan for Multiple Antipsychotic Therapies: NA  Medication List  As of 05/09/2011 10:36 PM   STOP taking these medications         escitalopram 20 MG tablet         TAKE these medications      Indication    ARIPiprazole 5 MG tablet   Commonly known as: ABILIFY   Take 1 tablet (5 mg total) by mouth daily. For depression.       lamoTRIgine 25 MG tablet   Commonly known as: LAMICTAL   Take 1 tablet (25 mg total) by mouth daily. For mood control       propranolol 60 MG 24 hr capsule   Commonly known as: INDERAL LA   Take 1 capsule (60 mg total) by mouth daily. For tremor       venlafaxine 37.5 MG 24 hr capsule   Commonly known as: EFFEXOR-XR   Take 1 capsule (37.5 mg total) by mouth daily with breakfast. For depression.  Follow-up Information    Follow up with Dr. Tiburcio Pea on 05/09/2011. (You are scheduled to see Dr. Tiburcio Pea on Tuesday, May 09, 2011 at 5:00 p.m.)    Contact information:   [address] [place], [place]  [address] [phone]      Follow up with Zada Girt on 05/10/2011. (You will see Marlin Canary in the [place] office on Wednesday, May 10, 2011 at 1:00 p.m.  Please arrive 15 minutes early)    Contact information:   [facility] [address] [place], [place] [address]  [phone]        Follow-up recommendations:  Other:  Follow-up with out patient providers  Comments:  Report any side effects to prividers  SignedDan Humphreys, Kasra Melvin 05/09/2011, 10:36 PM

## 2011-05-11 NOTE — Progress Notes (Signed)
Patient Discharge Instructions:  Psychiatric Admission Assessment Note Faxed,  05/10/2011 Discharge Summary Note Faxed,   05/10/2011 After Visit Summary (AVS) Faxed,  05/10/2011 Face Sheet Faxed, 05/10/2011 Faxed to the Next Level Care provider:  05/10/2011  Faxed to [facility] - Zada Girt @ [phone] And to Dr. Tiburcio Pea @ [phone]  Wandra Scot, 05/11/2011, 9:43 AM

## 2011-07-24 ENCOUNTER — Emergency Department (HOSPITAL_COMMUNITY): Payer: Self-pay

## 2011-07-24 ENCOUNTER — Emergency Department (HOSPITAL_COMMUNITY)
Admission: EM | Admit: 2011-07-24 | Discharge: 2011-07-24 | Disposition: A | Discharge status: Home / Self Care | Payer: Self-pay | Attending: Emergency Medicine | Admitting: Emergency Medicine

## 2011-07-24 ENCOUNTER — Encounter (HOSPITAL_COMMUNITY): Payer: Self-pay | Admitting: *Deleted

## 2011-07-24 DIAGNOSIS — R109 Unspecified abdominal pain: Secondary | ICD-10-CM | POA: Insufficient documentation

## 2011-07-24 DIAGNOSIS — Z79899 Other long term (current) drug therapy: Secondary | ICD-10-CM | POA: Insufficient documentation

## 2011-07-24 DIAGNOSIS — R110 Nausea: Secondary | ICD-10-CM | POA: Insufficient documentation

## 2011-07-24 DIAGNOSIS — F341 Dysthymic disorder: Secondary | ICD-10-CM | POA: Insufficient documentation

## 2011-07-24 DIAGNOSIS — B9689 Other specified bacterial agents as the cause of diseases classified elsewhere: Secondary | ICD-10-CM | POA: Insufficient documentation

## 2011-07-24 DIAGNOSIS — A499 Bacterial infection, unspecified: Secondary | ICD-10-CM | POA: Insufficient documentation

## 2011-07-24 DIAGNOSIS — R632 Polyphagia: Secondary | ICD-10-CM | POA: Insufficient documentation

## 2011-07-24 DIAGNOSIS — E876 Hypokalemia: Secondary | ICD-10-CM | POA: Insufficient documentation

## 2011-07-24 DIAGNOSIS — R102 Pelvic and perineal pain: Secondary | ICD-10-CM

## 2011-07-24 DIAGNOSIS — N949 Unspecified condition associated with female genital organs and menstrual cycle: Secondary | ICD-10-CM | POA: Insufficient documentation

## 2011-07-24 DIAGNOSIS — R351 Nocturia: Secondary | ICD-10-CM | POA: Insufficient documentation

## 2011-07-24 DIAGNOSIS — N760 Acute vaginitis: Secondary | ICD-10-CM | POA: Insufficient documentation

## 2011-07-24 DIAGNOSIS — N898 Other specified noninflammatory disorders of vagina: Secondary | ICD-10-CM | POA: Insufficient documentation

## 2011-07-24 LAB — BASIC METABOLIC PANEL
CO2: 26 mEq/L (ref 19–32)
GFR calc non Af Amer: >90 mL/min (ref >90)
Glucose, Bld: 84 mg/dL (ref 70–99)
Potassium: 3.1 mEq/L — ABNORMAL LOW (ref 3.5–5.1)
Sodium: 138 mEq/L (ref 135–145)

## 2011-07-24 LAB — POCT PREGNANCY, URINE: Preg Test, Ur: NEGATIVE

## 2011-07-24 LAB — URINALYSIS, ROUTINE W REFLEX MICROSCOPIC
Glucose, UA: NEGATIVE mg/dL
Hgb urine dipstick: NEGATIVE
Specific Gravity, Urine: 1.025 (ref 1.005–1.030)

## 2011-07-24 LAB — WET PREP, GENITAL: Trich, Wet Prep: NONE SEEN

## 2011-07-24 LAB — URINE MICROSCOPIC-ADD ON

## 2011-07-24 LAB — CBC
Hemoglobin: 12.4 g/dL (ref 12.0–15.0)
MCH: 32 pg (ref 26.0–34.0)
RBC: 3.88 MIL/uL (ref 3.87–5.11)
WBC: 8.3 10*3/uL (ref 4.0–10.5)

## 2011-07-24 MED ORDER — CEFTRIAXONE SODIUM 250 MG IJ SOLR
250.0000 mg | Freq: Once | INTRAMUSCULAR | Status: AC
  Administered 2011-07-24: 250 mg via INTRAMUSCULAR
  Filled 2011-07-24: qty 250

## 2011-07-24 MED ORDER — POTASSIUM CHLORIDE CRYS ER 20 MEQ PO TBCR: 20.0000 meq | EXTENDED_RELEASE_TABLET | Freq: Two times a day (BID) | ORAL | Status: DC

## 2011-07-24 MED ORDER — DOXYCYCLINE HYCLATE 100 MG PO CAPS: 100.0000 mg | ORAL_CAPSULE | Freq: Two times a day (BID) | ORAL | Status: AC

## 2011-07-24 MED ORDER — TRAMADOL-ACETAMINOPHEN 37.5-325 MG PO TABS: ORAL_TABLET | ORAL | Status: AC

## 2011-07-24 MED ORDER — METRONIDAZOLE 500 MG PO TABS: 500.0000 mg | ORAL_TABLET | Freq: Three times a day (TID) | ORAL | Status: AC

## 2011-07-24 MED ORDER — KETOROLAC TROMETHAMINE 60 MG/2ML IM SOLN
60.0000 mg | Freq: Once | INTRAMUSCULAR | Status: AC
  Administered 2011-07-24: 60 mg via INTRAMUSCULAR
  Filled 2011-07-24: qty 2

## 2011-07-24 NOTE — Discharge Instructions (Signed)
You have a minor pelvic infection called bacterial vaginosis. Take the antibiotics until gone. You will be called if you're STD tests are positive. Take the Ultracet with ibuprofen 600 mg 4 times a day for pain. Recheck with Dr. Emelda Fear if you continue to have discomfort or irregularity of your periods.

## 2011-07-24 NOTE — ED Provider Notes (Signed)
History  This chart was scribed for Ward Givens, MD by Sanjuana Letters Ajibulu. This patient was seen in room APA03/APA03 and the patient's care was started at 3:58PM.   CSN: [id number]  Arrival date & time 07/24/11  1236   First MD Initiated Contact with Patient 07/24/11 1527      Chief Complaint  Patient presents with  . Abdominal Pain    (Consider location/radiation/quality/duration/timing/severity/associated sxs/prior treatment) The history is provided by the patient. No language interpreter was used.   Marra Fraga is a 21 y.o. female who presents to the Emergency Department complaining of 2 days of gradual onset, gradually worsening suprapubic and RLQ abdominal pain. Pt reports that she had her last child in September and has been bleeding every day since then until 3 weeks ago. She states that Dr Emelda Fear prescribed her medication to control her bleeding, however her bleeding didn't stop and she has run out of the medication. She states that she was on depo provera since October and renewed January but did not get it renewed in April. Pt states that coughing, sitting and walking exacerbates her pain. She states that fetal postion gives her some relief but denies taking any medication. She states that she has had a previous episode but but was attributed to her appendix and has since then had it removed. Pt denies SOB, neck pain, eye pain, SOB, chest pain, vomiting, dysuria, back pain, rash and HA as associated symptoms. Pt reports appetite change, nausea and nocturia as associated symptoms.She relates she has her usual discharge   PCP Paulene Floor [facility] in [place]  GYN Dr Emelda Fear  Past Medical History  Diagnosis Date  . Cystic fibrosis gene carrier   . Depression   . Anxiety     Past Surgical History  Procedure Date  . Appendectomy     No family history on file.  History  Substance Use Topics  . Smoking status: Current Everyday Smoker -- 0.5 packs/day for 5  years    Types: Cigarettes  . Smokeless tobacco: Not on file  . 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  Respiratory: Negative for shortness of breath.   Gastrointestinal: Positive for nausea and abdominal pain. Negative for vomiting.  Genitourinary: Positive for vaginal discharge (normal). Negative for dysuria.       Nocturia   All other systems reviewed and are negative.    Allergies  Review of patient's allergies indicates no known allergies.  Home Medications   Current Outpatient Rx  Name Route Sig Dispense Refill  . ARIPIPRAZOLE 5 MG PO TABS Oral Take 1 tablet (5 mg total) by mouth daily. For depression. 30 tablet 0  . LAMOTRIGINE 25 MG PO TABS Oral Take 1 tablet (25 mg total) by mouth daily. For mood control 30 tablet 0  . PROPRANOLOL HCL ER 60 MG PO CP24 Oral Take 1 capsule (60 mg total) by mouth daily. For tremor 30 capsule 0  . VENLAFAXINE HCL ER 37.5 MG PO CP24 Oral Take 1 capsule (37.5 mg total) by mouth daily with breakfast. For depression. 30 capsule 0    Triage Vitals: BP 111/52  Pulse 76  Temp(Src) 98.3 F (36.8 C) (Oral)  Resp 16  Ht 5\' 2"  (1.575 m)  Wt 107 lb (48.535 kg)  BMI 19.57 kg/m2  SpO2 100%  Vital signs normal    Physical Exam  Nursing note and vitals reviewed. Constitutional: She is oriented to person, place, and time. She appears well-developed and well-nourished.  Non-toxic appearance. She does not appear ill. No distress.  HENT:  Head: Normocephalic and atraumatic.  Right Ear: External ear normal.  Left Ear: External ear normal.  Nose: Nose normal. No mucosal edema or rhinorrhea.  Mouth/Throat: Oropharynx is clear and moist and mucous membranes are normal. No dental abscesses or uvula swelling.  Eyes: Conjunctivae and EOM  are normal. Pupils are equal, round, and reactive to light.  Neck: Normal range of motion and full passive range of motion without pain. Neck supple.  Cardiovascular: Normal rate, regular rhythm and normal heart sounds.  Exam reveals no gallop and no friction rub.   No murmur heard. Pulmonary/Chest: Effort normal and breath sounds normal. No respiratory distress. She has no wheezes. She has no rhonchi. She has no rales. She exhibits no tenderness and no crepitus.  Abdominal: Soft. Normal appearance and bowel sounds are normal. She exhibits no distension. There is tenderness (midly tender RLQ). There is no rebound and no guarding.       No pain on knee tapping, mild discomfort with psoas sign Has a well healed suprapubic midline vertical surgical scar   Genitourinary:       External genitalia, minimal white discharge in the vault, uterus appears normal size and very tender to palpation, ovaries very tender to palpation without obvious masses  Musculoskeletal: Normal range of motion. She exhibits no edema and no tenderness.       Moves all extremities well.   Neurological: She is alert and oriented to person, place, and time. She has normal strength. No cranial nerve deficit.  Skin: Skin is warm, dry and intact. No rash noted. No erythema. No pallor.  Psychiatric: She has a normal mood and affect. Her speech is normal and behavior is normal. Her mood appears not anxious.    ED Course  Procedures (including critical care time) DIAGNOSTIC STUDIES: Oxygen Saturation is 100% on room air, normal by my interpretation.    COORDINATION OF CARE:   Medications  ketorolac (TORADOL) injection 60 mg (60 mg Intramuscular Given 07/24/11 1822)  cefTRIAXone (ROCEPHIN) injection 250 mg (250 mg Intramuscular Given 07/24/11 1902)     4:07PM Discussed ordering an ultrasound of her right ovary to check for abnormalities with pt and pt agreed   Results for orders placed during the hospital encounter of  07/24/11  URINALYSIS, ROUTINE W REFLEX MICROSCOPIC      Component Value Range   Color, Urine YELLOW  YELLOW    APPearance CLEAR  CLEAR    Specific Gravity, Urine 1.025  1.005 - 1.030    pH 6.0  5.0 - 8.0    Glucose, UA NEGATIVE  NEGATIVE (mg/dL)   Hgb urine dipstick NEGATIVE  NEGATIVE    Bilirubin Urine NEGATIVE  NEGATIVE    Ketones, ur NEGATIVE  NEGATIVE (mg/dL)   Protein, ur NEGATIVE  NEGATIVE (mg/dL)   Urobilinogen, UA 0.2  0.0 - 1.0 (mg/dL)   Nitrite NEGATIVE  NEGATIVE    Leukocytes, UA TRACE (*) NEGATIVE   POCT PREGNANCY, URINE      Component Value Range   Preg Test, Ur NEGATIVE  NEGATIVE   URINE MICROSCOPIC-ADD ON      Component Value Range  Squamous Epithelial / LPF FEW (*) RARE    WBC, UA 0-2  <3 (WBC/hpf)   RBC / HPF 3-6  <3 (RBC/hpf)   Bacteria, UA FEW (*) RARE   WET PREP, GENITAL      Component Value Range   Yeast Wet Prep HPF POC NONE SEEN  NONE SEEN    Trich, Wet Prep NONE SEEN  NONE SEEN    Clue Cells Wet Prep HPF POC MANY (*) NONE SEEN    WBC, Wet Prep HPF POC FEW (*) NONE SEEN   BASIC METABOLIC PANEL      Component Value Range   Sodium 138  135 - 145 (mEq/L)   Potassium 3.1 (*) 3.5 - 5.1 (mEq/L)   Chloride 104  96 - 112 (mEq/L)   CO2 26  19 - 32 (mEq/L)   Glucose, Bld 84  70 - 99 (mg/dL)   BUN 6  6 - 23 (mg/dL)   Creatinine, Ser 2.13  0.50 - 1.10 (mg/dL)   Calcium 9.3  8.4 - 08.6 (mg/dL)   GFR calc non Af Amer >90  >90 (mL/min)   GFR calc Af Amer >90  >90 (mL/min)  CBC      Component Value Range   WBC 8.3  4.0 - 10.5 (K/uL)   RBC 3.88  3.87 - 5.11 (MIL/uL)   Hemoglobin 12.4  12.0 - 15.0 (g/dL)   HCT 57.8  46.9 - 62.9 (%)   MCV 94.8  78.0 - 100.0 (fL)   MCH 32.0  26.0 - 34.0 (pg)   MCHC 33.7  30.0 - 36.0 (g/dL)   RDW 52.8  41.3 - 24.4 (%)   Platelets 228  150 - 400 (K/uL)    Laboratory interpretation all normal except hypokalemia, bacterial vaginosis   US Transvaginal Non-ob US Pelvis Complete US Art/ven Flow Abd Pelv Doppler  07/24/2011   *RADIOLOGY REPORT*  Clinical Data:  Right lower quadrant pain question ovarian torsion  TRANSABDOMINAL AND TRANSVAGINAL ULTRASOUND OF PELVIS DOPPLER ULTRASOUND OF OVARIES  Technique:  Both transabdominal and transvaginal ultrasound examinations of the pelvis were performed. Transabdominal technique was performed for global imaging of the pelvis including uterus, ovaries, adnexal regions, and pelvic cul-de-sac.  It was necessary to proceed with endovaginal exam following the transabdominal exam to visualize the left and right adnexae for assessment of blood flow within the ovaries.  Color and duplex Doppler ultrasound was utilized to evaluate blood flow to the ovaries.  Comparison:  03/29/2011  Findings:  Uterus:  Anteverted, 8.6 cm length by 3.3 cm AP by 6.4 cm transverse.  Mild heterogeneity of myometrial echogenicity. Several tiny nonshadowing echogenic foci are seen within the myometrium, also noted on previous exam, question tiny calcifications.  No focal mass lesions.  Cervix unremarkable.  Endometrium:  4 mm diameter, normal in thickness.  No endometrial fluid.  Right ovary: 3.3 x 1.9 x 2.4 cm.  Normal-appearing follicles cysts within right ovary.  No dominant solid or cystic mass.  Internal blood flow present within right ovary on color Doppler imaging.  Left ovary: 3.0 x 1.8 x 1.9 cm.  Scattered normal-appearing follicles within left ovary.  No dominant solid or cystic mass. Internal blood flow present within left ovary on color Doppler imaging.  Pulsed Doppler evaluation demonstrates normal low-resistance arterial and venous waveforms in both ovaries.  Other findings: Trace free pelvic fluid in cul-de-sac.  IMPRESSION: Nonspecific trace free pelvic fluid. No pelvic mass or additional pelvic abnormality identified. Specifically, no sonographic evidence for ovarian torsion.  Original Report Authenticated By: Lollie Marrow, M.D.     1. BV (bacterial vaginosis)   2. Pelvic pain   3. Hypokalemia     New  Prescriptions   DOXYCYCLINE (VIBRAMYCIN) 100 MG CAPSULE    Take 1 capsule (100 mg total) by mouth 2 (two) times daily.   METRONIDAZOLE (FLAGYL) 500 MG TABLET    Take 1 tablet (500 mg total) by mouth 3 (three) times daily.   POTASSIUM CHLORIDE SA (K-DUR,KLOR-CON) 20 MEQ TABLET    Take 1 tablet (20 mEq total) by mouth 2 (two) times daily.   TRAMADOL-ACETAMINOPHEN (ULTRACET) 37.5-325 MG PER TABLET    2 tabs po QID prn pain    Plan discharge  Devoria Albe, MD, FACEP   MDM   I personally performed the services described in this documentation, which was scribed in my presence. The recorded information has been reviewed and considered. Devoria Albe, MD, Armando Gang    Ward Givens, MD 07/24/11 [id number]

## 2011-07-24 NOTE — ED Notes (Signed)
Discharge instructions given and reviewed with patient.  Prescriptions given for Ultracet, Flagyl, Doxycycline and KCL; effects and use explained for each.  Patient verbalized understanding to complete all antibiotic, even if she began feeling better.  Patient ambulatory; discharged home in good condition.

## 2011-07-24 NOTE — ED Notes (Addendum)
Low abd pain, nausea.no vomiting , no diarrhea.    No fever.  Last bm yesterday.  Had baby in Sept.  Had vag bleeding daily until 2 weeks ago. Depo shot ran out 4/15

## 2012-02-15 ENCOUNTER — Encounter (HOSPITAL_COMMUNITY): Payer: Self-pay | Admitting: *Deleted

## 2012-02-15 ENCOUNTER — Emergency Department (HOSPITAL_COMMUNITY)
Admission: EM | Admit: 2012-02-15 | Discharge: 2012-02-15 | Disposition: A | Discharge status: Home / Self Care | Payer: Self-pay | Attending: Emergency Medicine | Admitting: Emergency Medicine

## 2012-02-15 DIAGNOSIS — N926 Irregular menstruation, unspecified: Secondary | ICD-10-CM

## 2012-02-15 DIAGNOSIS — Z141 Cystic fibrosis carrier: Secondary | ICD-10-CM | POA: Insufficient documentation

## 2012-02-15 DIAGNOSIS — Z79899 Other long term (current) drug therapy: Secondary | ICD-10-CM | POA: Insufficient documentation

## 2012-02-15 DIAGNOSIS — F17200 Nicotine dependence, unspecified, uncomplicated: Secondary | ICD-10-CM | POA: Insufficient documentation

## 2012-02-15 DIAGNOSIS — F329 Major depressive disorder, single episode, unspecified: Secondary | ICD-10-CM | POA: Insufficient documentation

## 2012-02-15 DIAGNOSIS — F411 Generalized anxiety disorder: Secondary | ICD-10-CM | POA: Insufficient documentation

## 2012-02-15 DIAGNOSIS — F3289 Other specified depressive episodes: Secondary | ICD-10-CM | POA: Insufficient documentation

## 2012-02-15 LAB — WET PREP, GENITAL
Clue Cells Wet Prep HPF POC: NONE SEEN
Trich, Wet Prep: NONE SEEN
Yeast Wet Prep HPF POC: NONE SEEN

## 2012-02-15 LAB — URINALYSIS, ROUTINE W REFLEX MICROSCOPIC
Glucose, UA: NEGATIVE mg/dL
Leukocytes, UA: NEGATIVE
pH: 6 (ref 5.0–8.0)

## 2012-02-15 NOTE — ED Notes (Addendum)
Reports suprapubic pressure, irregular periods, and lower abd swelling x 2 months.  States she has been spotting (light pink discharge with wiping only) since yesterday.  Reports has taken multiple home pregnancy tests with negative results.

## 2012-02-15 NOTE — ED Notes (Addendum)
"  Irregualr periods since October, gaining weight, and stomach is hard. For past week, been having a strong odor ." per pt. Pt states she mainly wants a blood test for pregnancy because she has taken urine tests, which have been negative, but she believes she may still be pregnant. Pt also states spotting, pink in color, yesterday. Mild pain to suprapubic area.

## 2012-02-15 NOTE — ED Provider Notes (Signed)
History    This chart was scribed for Charles B. Bernette Mayers, MD, MD by Smitty Pluck, ED Scribe. The patient was seen in room APA17 and the patient's care was started at 2:15PM.   CSN: [id number]  Arrival date & time 02/15/12  1237      Chief Complaint  Patient presents with  . Vaginal Bleeding    (Consider location/radiation/quality/duration/timing/severity/associated sxs/prior treatment) The history is provided by the patient. No language interpreter was used.   Jeanne Hernandez is a 21 y.o. female who presents to the Emergency Department complaining of vaginal bleeding onset today. She reports having white vaginal discharge and change in odor of vaginal discharge. She states that she has pink color spotting today. Pt reports having irregular menstrual period Oct 1-6 2013and then Oct 20-24. She reports that before Oct she had her period every 21 days. She had her next period Nov 17-21. Pt reports that her abdomen is hard and that she is gaining weight.    Past Medical History  Diagnosis Date  . Cystic fibrosis gene carrier   . Depression   . Anxiety     Past Surgical History  Procedure Date  . Appendectomy     No family history on file.  History  Substance Use Topics  . Smoking status: Current Every Day Smoker -- 0.5 packs/day for 5 years    Types: Cigarettes  . Smokeless tobacco: Not on file  . Alcohol Use: No    OB History    Grav Para Term Preterm Abortions TAB SAB Ect Mult Living   2 2 2       1       Review of Systems  All other systems reviewed and are negative.   10 Systems reviewed and all are negative for acute change except as noted in the HPI.   Allergies  Review of patient's allergies indicates no known allergies.  Home Medications   Current Outpatient Rx  Name  Route  Sig  Dispense  Refill  . ARIPIPRAZOLE 5 MG PO TABS   Oral   Take 1 tablet (5 mg total) by mouth daily. For depression.   30 tablet   0   . LAMOTRIGINE 25 MG PO TABS   Oral    Take 1 tablet (25 mg total) by mouth daily. For mood control   30 tablet   0   . POTASSIUM CHLORIDE CRYS ER 20 MEQ PO TBCR   Oral   Take 1 tablet (20 mEq total) by mouth 2 (two) times daily.   10 tablet   0   . PROPRANOLOL HCL ER 60 MG PO CP24   Oral   Take 1 capsule (60 mg total) by mouth daily. For tremor   30 capsule   0   . VENLAFAXINE HCL ER 37.5 MG PO CP24   Oral   Take 1 capsule (37.5 mg total) by mouth daily with breakfast. For depression.   30 capsule   0     BP 107/67  Pulse 69  Temp 98.8 F (37.1 C) (Oral)  Resp 16  Ht 5\' 2"  (1.575 m)  Wt 110 lb (49.896 kg)  BMI 20.12 kg/m2  SpO2 100%  LMP 01/21/2012  Physical Exam  Nursing note and vitals reviewed. Constitutional: She is oriented to person, place, and time. She appears well-developed and well-nourished.  HENT:  Head: Normocephalic and atraumatic.  Eyes: EOM are normal. Pupils are equal, round, and reactive to light.  Neck: Normal range of motion. Neck  supple.  Cardiovascular: Normal rate, normal heart sounds and intact distal pulses.   Pulmonary/Chest: Effort normal and breath sounds normal.  Abdominal: Bowel sounds are normal. She exhibits no distension. There is no tenderness.  Genitourinary: Uterus is not tender. Cervix exhibits no motion tenderness, no discharge and no friability. Right adnexum displays no mass and no tenderness. Left adnexum displays no mass and no tenderness. There is bleeding around the vagina. No vaginal discharge found.  Musculoskeletal: Normal range of motion. She exhibits no edema and no tenderness.  Neurological: She is alert and oriented to person, place, and time. She has normal strength. No cranial nerve deficit or sensory deficit.  Skin: Skin is warm and dry. No rash noted.  Psychiatric: She has a normal mood and affect.    ED Course  Procedures (including critical care time) DIAGNOSTIC STUDIES: Oxygen Saturation is 100% on room air, normal by my interpretation.     COORDINATION OF CARE: 2:20 PM Discussed ED treatment with pt     Labs Reviewed  URINALYSIS, ROUTINE W REFLEX MICROSCOPIC - Abnormal; Notable for the following:    Specific Gravity, Urine >1.030 (*)     Hgb urine dipstick LARGE (*)     All other components within normal limits  WET PREP, GENITAL - Abnormal; Notable for the following:    WBC, Wet Prep HPF POC FEW (*)     All other components within normal limits  URINE MICROSCOPIC-ADD ON - Abnormal; Notable for the following:    Squamous Epithelial / LPF FEW (*)     All other components within normal limits  HCG, SERUM, QUALITATIVE  GC/CHLAMYDIA PROBE AMP   No results found.   No diagnosis found.    MDM  Pt is not pregnant. Wet prep neg. Advised follow up at [facility] for further evaluation of irregular menses.       I personally performed the services described in this documentation, which was scribed in my presence. The recorded information has been reviewed and is accurate.     Charles B. Bernette Mayers, MD 02/15/12 [phone]

## 2012-07-01 ENCOUNTER — Other Ambulatory Visit: Payer: Self-pay | Admitting: Obstetrics & Gynecology

## 2012-07-01 DIAGNOSIS — O3680X0 Pregnancy with inconclusive fetal viability, not applicable or unspecified: Secondary | ICD-10-CM

## 2012-07-04 ENCOUNTER — Other Ambulatory Visit: Payer: Self-pay | Admitting: Obstetrics & Gynecology

## 2012-07-04 ENCOUNTER — Ambulatory Visit (INDEPENDENT_AMBULATORY_CARE_PROVIDER_SITE_OTHER): Payer: Medicaid Other

## 2012-07-04 DIAGNOSIS — O3680X0 Pregnancy with inconclusive fetal viability, not applicable or unspecified: Secondary | ICD-10-CM

## 2012-07-04 NOTE — Progress Notes (Signed)
U/S-transvaginal u/s performed, single intrauterine gestational sac noted with yolk sac noted, CRL c/w LMP dates +FCA noted FHR=179BPM cx long and closed, bilateral adnexa wnl

## 2012-07-08 LAB — US OB TRANSVAGINAL

## 2012-07-22 ENCOUNTER — Encounter: Payer: Self-pay | Admitting: *Deleted

## 2012-07-23 ENCOUNTER — Encounter: Payer: Self-pay | Admitting: Women's Health

## 2012-08-01 ENCOUNTER — Other Ambulatory Visit (HOSPITAL_COMMUNITY)
Admission: RE | Admit: 2012-08-01 | Discharge: 2012-08-01 | Disposition: A | Discharge status: Home / Self Care | Payer: Self-pay | Source: Ambulatory Visit | Attending: Advanced Practice Midwife | Admitting: Advanced Practice Midwife

## 2012-08-01 ENCOUNTER — Ambulatory Visit (INDEPENDENT_AMBULATORY_CARE_PROVIDER_SITE_OTHER): Payer: Medicaid Other | Admitting: Advanced Practice Midwife

## 2012-08-01 ENCOUNTER — Encounter: Payer: Self-pay | Admitting: Advanced Practice Midwife

## 2012-08-01 VITALS — BP 110/50 | Wt 102.0 lb

## 2012-08-01 DIAGNOSIS — Z113 Encounter for screening for infections with a predominantly sexual mode of transmission: Secondary | ICD-10-CM | POA: Insufficient documentation

## 2012-08-01 DIAGNOSIS — Z01419 Encounter for gynecological examination (general) (routine) without abnormal findings: Secondary | ICD-10-CM | POA: Insufficient documentation

## 2012-08-01 DIAGNOSIS — Z3481 Encounter for supervision of other normal pregnancy, first trimester: Secondary | ICD-10-CM

## 2012-08-01 DIAGNOSIS — Z3490 Encounter for supervision of normal pregnancy, unspecified, unspecified trimester: Secondary | ICD-10-CM

## 2012-08-01 DIAGNOSIS — Z1389 Encounter for screening for other disorder: Secondary | ICD-10-CM

## 2012-08-01 DIAGNOSIS — Z331 Pregnant state, incidental: Secondary | ICD-10-CM

## 2012-08-01 DIAGNOSIS — O99019 Anemia complicating pregnancy, unspecified trimester: Secondary | ICD-10-CM

## 2012-08-01 DIAGNOSIS — O99340 Other mental disorders complicating pregnancy, unspecified trimester: Secondary | ICD-10-CM

## 2012-08-01 LAB — CBC
HCT: 35.1 % — ABNORMAL LOW (ref 36.0–46.0)
MCH: 32.2 pg (ref 26.0–34.0)
MCHC: 33.6 g/dL (ref 30.0–36.0)
MCV: 95.6 fL (ref 78.0–100.0)
RDW: 13.4 % (ref 11.5–15.5)

## 2012-08-01 LAB — RPR

## 2012-08-01 LAB — POCT URINALYSIS DIPSTICK
Blood, UA: NEGATIVE
Nitrite, UA: NEGATIVE

## 2012-08-01 NOTE — Progress Notes (Signed)
Cramping some. New OB packet given. Consents signed.

## 2012-08-01 NOTE — Progress Notes (Addendum)
  Subjective:    Jeanne Hernandez is a [id number] [redacted]w[redacted]d being seen today for her first obstetrical visit.  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  Pregnancy history fully reviewed.    Patient reports mild cramping.  Filed Vitals:   08/01/12 1009  BP: 110/50  Weight: 102 lb (46.267 kg)    HISTORY: OB History   Grav Para Term Preterm Abortions TAB SAB Ect Mult Living   3 2 2       2      # Outc Date GA Lbr Len/2nd Wgt Sex Del Anes PTL Lv   1 TRM 3/10 [redacted]w[redacted]d 16:00 6lb12oz(3.062kg) M SVD EPI No Yes   2 TRM 9/12 [redacted]w[redacted]d -19:36 / 00:07 6lb3.8oz(2.828kg) M SVD EPI  Yes   Comments: none   3 CUR              Past Medical History  Diagnosis Date  . Cystic fibrosis gene carrier   . Depression   . Anxiety    Past Surgical History  Procedure Laterality Date  . Appendectomy     History reviewed. No pertinent family history.   Exam       Pelvic Exam:    Perineum: Normal Perineum   Vulva: normal   Vagina:  normal mucosa, normal discharge, no palpable nodules   Uterus         Cervix: normal   Adnexa: Not palpable   Urinary:  urethral meatus normal    System: Breast:  normal appearance, no masses or tenderness   Skin: normal coloration and turgor   Had onset of tiny pruriticvesicles with clear fluid around umbilicus, thighs, and hands a few weeks ago.  Currently only has a few around umbilicus, no erythema or rash, and is "getting better" per pt.    Neurologic: oriented, normal, normal mood   Extremities: normal strength, tone, and muscle mass   HEENT PERRLA   Mouth/Teeth mucous membranes moist, pharynx normal without lesions   Neck supple and no masses   Cardiovascular: regular rate and rhythm   Respiratory:  appears well, vitals normal, no respiratory distress, acyanotic, normal RR   Abdomen: soft, non-tender; bowel sounds normal; no masses,  no organomegaly          Assessment:    Pregnancy: [id number] Patient Active Problem List   Diagnosis Date Noted  .  Pregnant 08/01/2012    Priority: Low  . Bipolar disorder current episode depressed 05/04/2011    Priority: Low    Class: Acute        Plan:  Pruritic vesicles:   Too early for pemphigoid gestationis (?) but watch for resolution/progression (increased risk for PTD,SGA, antenatal testing would be recommended)  FOB declines CF testing Initial labs drawn. Prenatal vitamins. Problem list reviewed and updated. Genetic Screening discussed Integrated Screen: requested.  Ultrasound discussed; fetal survey: requested.  Follow up in 2 weeks for NT/IT  CRESENZO-DISHMAN,FRANCESFOB  08/01/2012

## 2012-08-02 LAB — URINALYSIS
Bilirubin Urine: NEGATIVE
Ketones, ur: NEGATIVE mg/dL
Specific Gravity, Urine: 1.028 (ref 1.005–1.030)
Urobilinogen, UA: 0.2 mg/dL (ref 0.0–1.0)

## 2012-08-02 LAB — ABO AND RH: Rh Type: POSITIVE

## 2012-08-02 LAB — DRUG SCREEN, URINE, NO CONFIRMATION
Barbiturate Quant, Ur: NEGATIVE
Benzodiazepines.: NEGATIVE
Marijuana Metabolite: NEGATIVE
Methadone: NEGATIVE
Opiate Screen, Urine: NEGATIVE
Propoxyphene: NEGATIVE

## 2012-08-03 LAB — URINE CULTURE: Organism ID, Bacteria: NO GROWTH

## 2012-08-14 ENCOUNTER — Ambulatory Visit (INDEPENDENT_AMBULATORY_CARE_PROVIDER_SITE_OTHER): Payer: Medicaid Other

## 2012-08-14 ENCOUNTER — Other Ambulatory Visit: Payer: Self-pay | Admitting: Advanced Practice Midwife

## 2012-08-14 ENCOUNTER — Encounter: Payer: Self-pay | Admitting: Advanced Practice Midwife

## 2012-08-14 ENCOUNTER — Other Ambulatory Visit: Payer: Self-pay | Admitting: Obstetrics & Gynecology

## 2012-08-14 ENCOUNTER — Ambulatory Visit (INDEPENDENT_AMBULATORY_CARE_PROVIDER_SITE_OTHER): Payer: Medicaid Other | Admitting: Advanced Practice Midwife

## 2012-08-14 VITALS — BP 100/62 | Wt 101.0 lb

## 2012-08-14 DIAGNOSIS — Z3481 Encounter for supervision of other normal pregnancy, first trimester: Secondary | ICD-10-CM

## 2012-08-14 DIAGNOSIS — Z1389 Encounter for screening for other disorder: Secondary | ICD-10-CM

## 2012-08-14 DIAGNOSIS — O2390 Unspecified genitourinary tract infection in pregnancy, unspecified trimester: Secondary | ICD-10-CM

## 2012-08-14 DIAGNOSIS — Z331 Pregnant state, incidental: Secondary | ICD-10-CM

## 2012-08-14 DIAGNOSIS — Z36 Encounter for antenatal screening of mother: Secondary | ICD-10-CM

## 2012-08-14 LAB — POCT URINALYSIS DIPSTICK
Blood, UA: NEGATIVE
Ketones, UA: NEGATIVE
Protein, UA: NEGATIVE

## 2012-08-14 MED ORDER — FLUCONAZOLE 150 MG PO TABS: ORAL_TABLET | ORAL | Status: DC

## 2012-08-14 NOTE — Progress Notes (Signed)
U/s performed. NT Screen,CRL-53 mm, NT-1.3 mm/[redacted]w[redacted]d on todays u/s, cx long and closed, meas. C/w datesactive,nml fluid,NB present, bilat ov's seen

## 2012-08-14 NOTE — Progress Notes (Signed)
.   1st IT/NT today.  Rash turns out to be tinea.  FOB has patches too. Has been using topical antifungal with good results, but would like oral diflcuan.  Will rx q week X 3 weeks

## 2012-08-14 NOTE — Telephone Encounter (Signed)
Pt with tinea, Diflucan weekly x 3

## 2012-09-11 ENCOUNTER — Other Ambulatory Visit: Payer: Self-pay | Admitting: Obstetrics & Gynecology

## 2012-09-11 ENCOUNTER — Ambulatory Visit (INDEPENDENT_AMBULATORY_CARE_PROVIDER_SITE_OTHER): Payer: Medicaid Other | Admitting: Obstetrics & Gynecology

## 2012-09-11 ENCOUNTER — Encounter: Payer: Self-pay | Admitting: Obstetrics & Gynecology

## 2012-09-11 VITALS — BP 90/58 | Wt 110.0 lb

## 2012-09-11 DIAGNOSIS — Z1389 Encounter for screening for other disorder: Secondary | ICD-10-CM

## 2012-09-11 DIAGNOSIS — Z3482 Encounter for supervision of other normal pregnancy, second trimester: Secondary | ICD-10-CM

## 2012-09-11 DIAGNOSIS — Z331 Pregnant state, incidental: Secondary | ICD-10-CM

## 2012-09-11 DIAGNOSIS — O99019 Anemia complicating pregnancy, unspecified trimester: Secondary | ICD-10-CM

## 2012-09-11 LAB — POCT URINALYSIS DIPSTICK
Blood, UA: NEGATIVE
Glucose, UA: NEGATIVE
Nitrite, UA: NEGATIVE

## 2012-09-11 NOTE — Patient Instructions (Signed)
Pregnancy - Second Trimester The second trimester of pregnancy (3 to 6 months) is a period of rapid growth for you and your baby. At the end of the sixth month, your baby is about 9 inches long and weighs 1 1/2 pounds. You will begin to feel the baby move between 18 and 20 weeks of the pregnancy. This is called quickening. Weight gain is faster. A clear fluid (colostrum) may leak out of your breasts. You may feel small contractions of the womb (uterus). This is known as false labor or Braxton-Hicks contractions. This is like a practice for labor when the baby is ready to be born. Usually, the problems with morning sickness have usually passed by the end of your first trimester. Some women develop small dark blotches (called cholasma, mask of pregnancy) on their face that usually goes away after the baby is born. Exposure to the sun makes the blotches worse. Acne may also develop in some pregnant women and pregnant women who have acne, may find that it goes away. PRENATAL EXAMS  Blood work may continue to be done during prenatal exams. These tests are done to check on your health and the probable health of your baby. Blood work is used to follow your blood levels (hemoglobin). Anemia (low hemoglobin) is common during pregnancy. Iron and vitamins are given to help prevent this. You will also be checked for diabetes between 24 and 28 weeks of the pregnancy. Some of the previous blood tests may be repeated.  The size of the uterus is measured during each visit. This is to make sure that the baby is continuing to grow properly according to the dates of the pregnancy.  Your blood pressure is checked every prenatal visit. This is to make sure you are not getting toxemia.  Your urine is checked to make sure you do not have an infection, diabetes or protein in the urine.  Your weight is checked often to make sure gains are happening at the suggested rate. This is to ensure that both you and your baby are  growing normally.  Sometimes, an ultrasound is performed to confirm the proper growth and development of the baby. This is a test which bounces harmless sound waves off the baby so your caregiver can more accurately determine due dates. Sometimes, a test is done on the amniotic fluid surrounding the baby. This test is called an amniocentesis. The amniotic fluid is obtained by sticking a needle into the belly (abdomen). This is done to check the chromosomes in instances where there is a concern about possible genetic problems with the baby. It is also sometimes done near the end of pregnancy if an early delivery is required. In this case, it is done to help make sure the baby's lungs are mature enough for the baby to live outside of the womb. CHANGES OCCURING IN THE SECOND TRIMESTER OF PREGNANCY Your body goes through many changes during pregnancy. They vary from person to person. Talk to your caregiver about changes you notice that you are concerned about.  During the second trimester, you will likely have an increase in your appetite. It is normal to have cravings for certain foods. This varies from person to person and pregnancy to pregnancy.  Your lower abdomen will begin to bulge.  You may have to urinate more often because the uterus and baby are pressing on your bladder. It is also common to get more bladder infections during pregnancy. You can help this by drinking lots of fluids   and emptying your bladder before and after intercourse.  You may begin to get stretch marks on your hips, abdomen, and breasts. These are normal changes in the body during pregnancy. There are no exercises or medicines to take that prevent this change.  You may begin to develop swollen and bulging veins (varicose veins) in your legs. Wearing support hose, elevating your feet for 15 minutes, 3 to 4 times a day and limiting salt in your diet helps lessen the problem.  Heartburn may develop as the uterus grows and  pushes up against the stomach. Antacids recommended by your caregiver helps with this problem. Also, eating smaller meals 4 to 5 times a day helps.  Constipation can be treated with a stool softener or adding bulk to your diet. Drinking lots of fluids, and eating vegetables, fruits, and whole grains are helpful.  Exercising is also helpful. If you have been very active up until your pregnancy, most of these activities can be continued during your pregnancy. If you have been less active, it is helpful to start an exercise program such as walking.  Hemorrhoids may develop at the end of the second trimester. Warm sitz baths and hemorrhoid cream recommended by your caregiver helps hemorrhoid problems.  Backaches may develop during this time of your pregnancy. Avoid heavy lifting, wear low heal shoes, and practice good posture to help with backache problems.  Some pregnant women develop tingling and numbness of their hand and fingers because of swelling and tightening of ligaments in the wrist (carpel tunnel syndrome). This goes away after the baby is born.  As your breasts enlarge, you may have to get a bigger bra. Get a comfortable, cotton, support bra. Do not get a nursing bra until the last month of the pregnancy if you will be nursing the baby.  You may get a dark line from your belly button to the pubic area called the linea nigra.  You may develop rosy cheeks because of increase blood flow to the face.  You may develop spider looking lines of the face, neck, arms, and chest. These go away after the baby is born. HOME CARE INSTRUCTIONS   It is extremely important to avoid all smoking, herbs, alcohol, and unprescribed drugs during your pregnancy. These chemicals affect the formation and growth of the baby. Avoid these chemicals throughout the pregnancy to ensure the delivery of a healthy infant.  Most of your home care instructions are the same as suggested for the first trimester of your  pregnancy. Keep your caregiver's appointments. Follow your caregiver's instructions regarding medicine use, exercise, and diet.  During pregnancy, you are providing food for you and your baby. Continue to eat regular, well-balanced meals. Choose foods such as meat, fish, milk and other low fat dairy products, vegetables, fruits, and whole-grain breads and cereals. Your caregiver will tell you of the ideal weight gain.  A physical sexual relationship may be continued up until near the end of pregnancy if there are no other problems. Problems could include early (premature) leaking of amniotic fluid from the membranes, vaginal bleeding, abdominal pain, or other medical or pregnancy problems.  Exercise regularly if there are no restrictions. Check with your caregiver if you are unsure of the safety of some of your exercises. The greatest weight gain will occur in the last 2 trimesters of pregnancy. Exercise will help you:  Control your weight.  Get you in shape for labor and delivery.  Lose weight after you have the baby.  Wear   a good support or jogging bra for breast tenderness during pregnancy. This may help if worn during sleep. Pads or tissues may be used in the bra if you are leaking colostrum.  Do not use hot tubs, steam rooms or saunas throughout the pregnancy.  Wear your seat belt at all times when driving. This protects you and your baby if you are in an accident.  Avoid raw meat, uncooked cheese, cat litter boxes, and soil used by cats. These carry germs that can cause birth defects in the baby.  The second trimester is also a good time to visit your dentist for your dental health if this has not been done yet. Getting your teeth cleaned is okay. Use a soft toothbrush. Brush gently during pregnancy.  It is easier to leak urine during pregnancy. Tightening up and strengthening the pelvic muscles will help with this problem. Practice stopping your urination while you are going to the  bathroom. These are the same muscles you need to strengthen. It is also the muscles you would use as if you were trying to stop from passing gas. You can practice tightening these muscles up 10 times a set and repeating this about 3 times per day. Once you know what muscles to tighten up, do not perform these exercises during urination. It is more likely to contribute to an infection by backing up the urine.  Ask for help if you have financial, counseling, or nutritional needs during pregnancy. Your caregiver will be able to offer counseling for these needs as well as refer you for other special needs.  Your skin may become oily. If so, wash your face with mild soap, use non-greasy moisturizer and oil or cream based makeup. MEDICINES AND DRUG USE IN PREGNANCY  Take prenatal vitamins as directed. The vitamin should contain 1 milligram of folic acid. Keep all vitamins out of reach of children. Only a couple vitamins or tablets containing iron may be fatal to a baby or young child when ingested.  Avoid use of all medicines, including herbs, over-the-counter medicines, not prescribed or suggested by your caregiver. Only take over-the-counter or prescription medicines for pain, discomfort, or fever as directed by your caregiver. Do not use aspirin.  Let your caregiver also know about herbs you may be using.  Alcohol is related to a number of birth defects. This includes fetal alcohol syndrome. All alcohol, in any form, should be avoided completely. Smoking will cause low birth rate and premature babies.  Street or illegal drugs are very harmful to the baby. They are absolutely forbidden. A baby born to an addicted mother will be addicted at birth. The baby will go through the same withdrawal an adult does. SEEK MEDICAL CARE IF:  You have any concerns or worries during your pregnancy. It is better to call with your questions if you feel they cannot wait, rather than worry about them. SEEK IMMEDIATE  MEDICAL CARE IF:   An unexplained oral temperature above 102 F (38.9 C) develops, or as your caregiver suggests.  You have leaking of fluid from the vagina (birth canal). If leaking membranes are suspected, take your temperature and tell your caregiver of this when you call.  There is vaginal spotting, bleeding, or passing clots. Tell your caregiver of the amount and how many pads are used. Light spotting in pregnancy is common, especially following intercourse.  You develop a bad smelling vaginal discharge with a change in the color from clear to white.  You continue to feel   sick to your stomach (nauseated) and have no relief from remedies suggested. You vomit blood or coffee ground-like materials.  You lose more than 2 pounds of weight or gain more than 2 pounds of weight over 1 week, or as suggested by your caregiver.  You notice swelling of your face, hands, feet, or legs.  You get exposed to German measles and have never had them.  You are exposed to fifth disease or chickenpox.  You develop belly (abdominal) pain. Round ligament discomfort is a common non-cancerous (benign) cause of abdominal pain in pregnancy. Your caregiver still must evaluate you.  You develop a bad headache that does not go away.  You develop fever, diarrhea, pain with urination, or shortness of breath.  You develop visual problems, blurry, or double vision.  You fall or are in a car accident or any kind of trauma.  There is mental or physical violence at home. Document Released: 02/14/2001 Document Revised: 11/15/2011 Document Reviewed: 08/19/2008 [employer] Patient Information 2014 [employer].  

## 2012-09-11 NOTE — Progress Notes (Signed)
BP weight and urine results all reviewed and noted. Patient reports good fetal movement, denies any bleeding and no rupture of membranes symptoms or regular contractions. Patient is without complaints. All questions were answered.  

## 2012-09-11 NOTE — Addendum Note (Signed)
Addended by: Criss Alvine on: 09/11/2012 03:00 PM   Modules accepted: Orders

## 2012-09-15 LAB — MATERNAL SCREEN, INTEGRATED #2
Age risk Down Syndrome: 1:1100 {titer}
Calculated Gestational Age: 15.7
Crown Rump Length: 52 mm
Estriol Mom: 1.82
Inhibin A Dimeric: 689 pg/mL
Inhibin A MoM: 3.42
MSS Trisomy 18 Risk: <1:5000 {titer}
NT MoM: 1.03
Rish for ONTD: 1:2100 {titer}
hCG MoM: 1.12

## 2012-10-01 ENCOUNTER — Telehealth: Payer: Self-pay | Admitting: Obstetrics & Gynecology

## 2012-10-01 DIAGNOSIS — Z3490 Encounter for supervision of normal pregnancy, unspecified, unspecified trimester: Secondary | ICD-10-CM

## 2012-10-01 NOTE — Telephone Encounter (Signed)
Pt states that she has had a wisdom tooth come up. Using salt water gargles, tylenol. Nothing helps! I spoke with Dr. Emelda Fear and he advised for the pt to find out if the extraction would be done under local anesthesia or if it would be under general anesthesia, and let us know. The pt is going to call the Dentist first thing in the morning and call me back.

## 2012-10-09 ENCOUNTER — Encounter: Payer: Self-pay | Admitting: Adult Health

## 2012-10-09 ENCOUNTER — Ambulatory Visit (INDEPENDENT_AMBULATORY_CARE_PROVIDER_SITE_OTHER): Payer: Medicaid Other

## 2012-10-09 ENCOUNTER — Ambulatory Visit (INDEPENDENT_AMBULATORY_CARE_PROVIDER_SITE_OTHER): Payer: Medicaid Other | Admitting: Adult Health

## 2012-10-09 VITALS — BP 122/68 | Wt 105.0 lb

## 2012-10-09 DIAGNOSIS — Z1389 Encounter for screening for other disorder: Secondary | ICD-10-CM

## 2012-10-09 DIAGNOSIS — O99891 Other specified diseases and conditions complicating pregnancy: Secondary | ICD-10-CM

## 2012-10-09 DIAGNOSIS — Z331 Pregnant state, incidental: Secondary | ICD-10-CM

## 2012-10-09 DIAGNOSIS — M549 Dorsalgia, unspecified: Secondary | ICD-10-CM | POA: Insufficient documentation

## 2012-10-09 DIAGNOSIS — O99019 Anemia complicating pregnancy, unspecified trimester: Secondary | ICD-10-CM

## 2012-10-09 DIAGNOSIS — Z3482 Encounter for supervision of other normal pregnancy, second trimester: Secondary | ICD-10-CM

## 2012-10-09 DIAGNOSIS — Z363 Encounter for antenatal screening for malformations: Secondary | ICD-10-CM

## 2012-10-09 HISTORY — DX: Other specified diseases and conditions complicating pregnancy: O99.891

## 2012-10-09 LAB — POCT URINALYSIS DIPSTICK
Blood, UA: 3
Glucose, UA: NEGATIVE
Ketones, UA: NEGATIVE
Nitrite, UA: NEGATIVE

## 2012-10-09 NOTE — Progress Notes (Signed)
Pt had [place], has pain low back,has point tenderness, try ice, wear tennis shoes follow up 1 week with Dr Despina Hidden may need injection of marcaine.she is worried about her weight, try to eat more often and eat ice cream at night.

## 2012-10-09 NOTE — Patient Instructions (Addendum)

## 2012-10-09 NOTE — Progress Notes (Signed)
C/o left back pain

## 2012-10-09 NOTE — Progress Notes (Signed)
U/S(20+1wks)-active fetus, meas c/w dates, fluid wnl, fundal gr 0 plac, cx long and closed, bilateral adnexa wnl, no major abnl noted (pt having gender reveal party later so gender wasn't shown to pt.,female fetus

## 2012-10-15 ENCOUNTER — Telehealth: Payer: Self-pay | Admitting: Adult Health

## 2012-10-15 NOTE — Telephone Encounter (Signed)
Pt states received an call from our office but not sure why. No one in office had called pt. Pt states not having a problem. Pt informed to keep scheduled appt with Dr. Despina Hidden.

## 2012-10-18 ENCOUNTER — Encounter: Payer: Medicaid Other | Admitting: Obstetrics & Gynecology

## 2012-10-23 ENCOUNTER — Encounter: Payer: Medicaid Other | Admitting: Obstetrics & Gynecology

## 2012-11-18 ENCOUNTER — Telehealth: Payer: Self-pay | Admitting: Obstetrics and Gynecology

## 2012-11-18 NOTE — Telephone Encounter (Signed)
Spoke with Apolinar Junes, pt's husband. Pt was in the shower. Pt has a sorethroat, started this am. No other symptoms. Advise to gargle with warm salt water and also try Hall's cough drops. If this don't help or if worse, let us know. Family voiced understanding. JSY

## 2012-11-21 ENCOUNTER — Telehealth: Payer: Self-pay | Admitting: Obstetrics & Gynecology

## 2012-11-21 ENCOUNTER — Telehealth: Payer: Self-pay | Admitting: *Deleted

## 2012-11-21 ENCOUNTER — Ambulatory Visit (INDEPENDENT_AMBULATORY_CARE_PROVIDER_SITE_OTHER): Payer: Medicaid Other | Admitting: Obstetrics & Gynecology

## 2012-11-21 VITALS — BP 108/58 | Wt 114.0 lb

## 2012-11-21 DIAGNOSIS — Z331 Pregnant state, incidental: Secondary | ICD-10-CM

## 2012-11-21 DIAGNOSIS — O99019 Anemia complicating pregnancy, unspecified trimester: Secondary | ICD-10-CM

## 2012-11-21 DIAGNOSIS — N760 Acute vaginitis: Secondary | ICD-10-CM | POA: Insufficient documentation

## 2012-11-21 DIAGNOSIS — Z1389 Encounter for screening for other disorder: Secondary | ICD-10-CM

## 2012-11-21 DIAGNOSIS — O2390 Unspecified genitourinary tract infection in pregnancy, unspecified trimester: Secondary | ICD-10-CM

## 2012-11-21 LAB — POCT URINALYSIS DIPSTICK
Blood, UA: NEGATIVE
Glucose, UA: NEGATIVE
Ketones, UA: NEGATIVE

## 2012-11-21 MED ORDER — METRONIDAZOLE 0.75 % VA GEL: VAGINAL | Status: DC

## 2012-11-21 NOTE — Telephone Encounter (Signed)
Pt informed Metronidazole tablets called in to her pharmacy.

## 2012-11-21 NOTE — Progress Notes (Signed)
Wet prep  +BV  [facility] Gel   Recommend maternity belt BP weight and urine results all reviewed and noted. Patient reports good fetal movement, denies any bleeding and no rupture of membranes symptoms or regular contractions. Patient is without complaints. All questions were answered.

## 2012-11-21 NOTE — Progress Notes (Signed)
Discharge with odor x 3 days

## 2012-11-21 NOTE — Telephone Encounter (Signed)
Metronidazole 500 mg bid x 7 days #14 per Dr. Despina Hidden. Pt insurance would not cover Metronidazole vaginal gel.

## 2012-11-22 ENCOUNTER — Encounter: Payer: Medicaid Other | Admitting: Obstetrics & Gynecology

## 2012-12-12 ENCOUNTER — Encounter: Payer: Medicaid Other | Admitting: Advanced Practice Midwife

## 2012-12-12 ENCOUNTER — Other Ambulatory Visit: Payer: Medicaid Other

## 2012-12-18 ENCOUNTER — Encounter: Payer: Self-pay | Admitting: Women's Health

## 2012-12-18 ENCOUNTER — Other Ambulatory Visit: Payer: Medicaid Other

## 2012-12-18 ENCOUNTER — Ambulatory Visit (INDEPENDENT_AMBULATORY_CARE_PROVIDER_SITE_OTHER): Payer: Medicaid Other | Admitting: Women's Health

## 2012-12-18 VITALS — BP 98/50 | Wt 121.0 lb

## 2012-12-18 DIAGNOSIS — Z23 Encounter for immunization: Secondary | ICD-10-CM

## 2012-12-18 DIAGNOSIS — Z331 Pregnant state, incidental: Secondary | ICD-10-CM

## 2012-12-18 DIAGNOSIS — Z3483 Encounter for supervision of other normal pregnancy, third trimester: Secondary | ICD-10-CM

## 2012-12-18 DIAGNOSIS — Z1389 Encounter for screening for other disorder: Secondary | ICD-10-CM

## 2012-12-18 DIAGNOSIS — O99019 Anemia complicating pregnancy, unspecified trimester: Secondary | ICD-10-CM

## 2012-12-18 LAB — POCT URINALYSIS DIPSTICK
Glucose, UA: NEGATIVE
Ketones, UA: NEGATIVE

## 2012-12-18 LAB — CBC
HCT: 30 % — ABNORMAL LOW (ref 36.0–46.0)
Hemoglobin: 10.4 g/dL — ABNORMAL LOW (ref 12.0–15.0)
MCH: 33.5 pg (ref 26.0–34.0)
MCV: 96.8 fL (ref 78.0–100.0)
Platelets: 317 10*3/uL (ref 150–400)
RBC: 3.1 MIL/uL — ABNORMAL LOW (ref 3.87–5.11)
WBC: 13.3 10*3/uL — ABNORMAL HIGH (ref 4.0–10.5)

## 2012-12-18 LAB — RPR

## 2012-12-18 MED ORDER — INFLUENZA VAC SPLIT QUAD 0.5 ML IM SUSP
0.5000 mL | Freq: Once | INTRAMUSCULAR | Status: AC
  Administered 2012-12-18: 0.5 mL via INTRAMUSCULAR

## 2012-12-18 NOTE — Patient Instructions (Signed)

## 2012-12-18 NOTE — Progress Notes (Signed)
Reports good fm. Denies uc's, lof, vb, urinary frequency, urgency, hesitancy, or dysuria.  LBP, recommended pregnancy belt and other relief measures.  Reviewed ptl s/s, fkc.  All questions answered. PN2 today. F/U in 2wks for visit.

## 2012-12-19 LAB — GLUCOSE TOLERANCE, 2 HOURS W/ 1HR
Glucose, 1 hour: 141 mg/dL (ref 70–170)
Glucose, Fasting: 73 mg/dL (ref 70–99)

## 2012-12-19 LAB — ANTIBODY SCREEN: Antibody Screen: NEGATIVE

## 2012-12-20 ENCOUNTER — Telehealth: Payer: Self-pay | Admitting: Women's Health

## 2012-12-20 ENCOUNTER — Encounter: Payer: Self-pay | Admitting: Women's Health

## 2012-12-20 DIAGNOSIS — O99013 Anemia complicating pregnancy, third trimester: Secondary | ICD-10-CM

## 2012-12-20 DIAGNOSIS — O99019 Anemia complicating pregnancy, unspecified trimester: Secondary | ICD-10-CM | POA: Insufficient documentation

## 2012-12-20 MED ORDER — FERROUS SULFATE 325 (65 FE) MG PO TABS: 325.0000 mg | ORAL_TABLET | Freq: Two times a day (BID) | ORAL | Status: DC

## 2012-12-20 NOTE — Telephone Encounter (Signed)
Attempted to contact pt, but no answer and voicemail not set up, to notify her of mild anemia, rx for ferrous sulfate bid, to take w/ OJ, and increase foods high in iron.   Cheral Marker, CNM, WHNP-BC 12/20/2012 11:12 AM

## 2013-01-01 ENCOUNTER — Encounter: Payer: Self-pay | Admitting: Advanced Practice Midwife

## 2013-01-01 ENCOUNTER — Ambulatory Visit (INDEPENDENT_AMBULATORY_CARE_PROVIDER_SITE_OTHER): Payer: Medicaid Other | Admitting: Advanced Practice Midwife

## 2013-01-01 VITALS — BP 102/62 | Wt 123.0 lb

## 2013-01-01 DIAGNOSIS — O2390 Unspecified genitourinary tract infection in pregnancy, unspecified trimester: Secondary | ICD-10-CM

## 2013-01-01 DIAGNOSIS — O26849 Uterine size-date discrepancy, unspecified trimester: Secondary | ICD-10-CM

## 2013-01-01 DIAGNOSIS — Z1389 Encounter for screening for other disorder: Secondary | ICD-10-CM

## 2013-01-01 DIAGNOSIS — Z331 Pregnant state, incidental: Secondary | ICD-10-CM

## 2013-01-01 DIAGNOSIS — O26843 Uterine size-date discrepancy, third trimester: Secondary | ICD-10-CM

## 2013-01-01 DIAGNOSIS — O99019 Anemia complicating pregnancy, unspecified trimester: Secondary | ICD-10-CM

## 2013-01-01 LAB — POCT URINALYSIS DIPSTICK
Glucose, UA: NEGATIVE
Ketones, UA: NEGATIVE
Leukocytes, UA: NEGATIVE
Nitrite, UA: NEGATIVE
Protein, UA: NEGATIVE

## 2013-01-01 NOTE — Progress Notes (Signed)
Having some thin watery discharge. Feels like she urinating on herself. Pelvic exam complete. No pooling seen just small amount of discharge. Measuring 29cm will  FU next week ultrasound for AFI and EFW. Denies VB and contractions. Lab results reviewed. All questions answered.

## 2013-01-10 ENCOUNTER — Encounter: Payer: Medicaid Other | Admitting: Obstetrics & Gynecology

## 2013-01-10 ENCOUNTER — Other Ambulatory Visit: Payer: Medicaid Other

## 2013-01-14 ENCOUNTER — Ambulatory Visit (INDEPENDENT_AMBULATORY_CARE_PROVIDER_SITE_OTHER): Payer: Medicaid Other | Admitting: Obstetrics & Gynecology

## 2013-01-14 ENCOUNTER — Other Ambulatory Visit: Payer: Self-pay | Admitting: Advanced Practice Midwife

## 2013-01-14 ENCOUNTER — Ambulatory Visit (INDEPENDENT_AMBULATORY_CARE_PROVIDER_SITE_OTHER): Payer: Medicaid Other

## 2013-01-14 ENCOUNTER — Other Ambulatory Visit: Payer: Self-pay | Admitting: Obstetrics & Gynecology

## 2013-01-14 ENCOUNTER — Encounter: Payer: Self-pay | Admitting: Obstetrics & Gynecology

## 2013-01-14 VITALS — BP 100/62 | Wt 125.5 lb

## 2013-01-14 DIAGNOSIS — O99333 Smoking (tobacco) complicating pregnancy, third trimester: Secondary | ICD-10-CM

## 2013-01-14 DIAGNOSIS — Z1389 Encounter for screening for other disorder: Secondary | ICD-10-CM

## 2013-01-14 DIAGNOSIS — O99330 Smoking (tobacco) complicating pregnancy, unspecified trimester: Secondary | ICD-10-CM

## 2013-01-14 DIAGNOSIS — O99340 Other mental disorders complicating pregnancy, unspecified trimester: Secondary | ICD-10-CM

## 2013-01-14 DIAGNOSIS — O26843 Uterine size-date discrepancy, third trimester: Secondary | ICD-10-CM

## 2013-01-14 DIAGNOSIS — O26849 Uterine size-date discrepancy, unspecified trimester: Secondary | ICD-10-CM

## 2013-01-14 DIAGNOSIS — Z331 Pregnant state, incidental: Secondary | ICD-10-CM

## 2013-01-14 DIAGNOSIS — O99019 Anemia complicating pregnancy, unspecified trimester: Secondary | ICD-10-CM

## 2013-01-14 LAB — POCT URINALYSIS DIPSTICK
Blood, UA: NEGATIVE
Protein, UA: NEGATIVE

## 2013-01-14 NOTE — Progress Notes (Signed)
Sonogram with good growth 28%, normal fluid BP weight and urine results all reviewed and noted. Patient reports good fetal movement, denies any bleeding and no rupture of membranes symptoms or regular contractions. Patient is without complaints. All questions were answered.

## 2013-01-14 NOTE — Progress Notes (Signed)
U/S(34+0wks)-vtx active fetus BPP 8/8, fluid WNL, AFI-11.9cm, fundal GR 3 placenta, UA Doppler RI- 0.70 & 0.69, FHR-129 bpm, EFW 4 lb 9 oz (28th%tile), female fetus

## 2013-01-16 ENCOUNTER — Encounter: Payer: Medicaid Other | Admitting: Obstetrics & Gynecology

## 2013-01-28 ENCOUNTER — Ambulatory Visit (INDEPENDENT_AMBULATORY_CARE_PROVIDER_SITE_OTHER): Payer: Medicaid Other | Admitting: Obstetrics and Gynecology

## 2013-01-28 VITALS — BP 120/60 | Wt 126.6 lb

## 2013-01-28 DIAGNOSIS — R319 Hematuria, unspecified: Secondary | ICD-10-CM

## 2013-01-28 DIAGNOSIS — Z3493 Encounter for supervision of normal pregnancy, unspecified, third trimester: Secondary | ICD-10-CM

## 2013-01-28 DIAGNOSIS — O99340 Other mental disorders complicating pregnancy, unspecified trimester: Secondary | ICD-10-CM

## 2013-01-28 DIAGNOSIS — Z1389 Encounter for screening for other disorder: Secondary | ICD-10-CM

## 2013-01-28 DIAGNOSIS — O26849 Uterine size-date discrepancy, unspecified trimester: Secondary | ICD-10-CM

## 2013-01-28 DIAGNOSIS — O99019 Anemia complicating pregnancy, unspecified trimester: Secondary | ICD-10-CM

## 2013-01-28 DIAGNOSIS — O99330 Smoking (tobacco) complicating pregnancy, unspecified trimester: Secondary | ICD-10-CM

## 2013-01-28 DIAGNOSIS — Z331 Pregnant state, incidental: Secondary | ICD-10-CM

## 2013-01-28 LAB — POCT URINALYSIS DIPSTICK
Blood, UA: 3
Ketones, UA: NEGATIVE

## 2013-01-28 NOTE — Addendum Note (Signed)
Addended by: Criss Alvine on: 01/28/2013 03:37 PM   Modules accepted: Orders

## 2013-01-28 NOTE — Progress Notes (Signed)
Good FM, no c/o,  Other than tenderness in skin from stretch marks. Advised to use binder,prn All questions answered.

## 2013-01-28 NOTE — Patient Instructions (Signed)
Group B Streptococcus Infection During Pregnancy Group B streptococcus (GBS) is a type of bacteria often found in healthy women. GBS usually does not cause any symptoms or harm to healthy adult women, but the bacteria can make a baby very sick if it is passed to the baby during childbirth. GBS is not a sexually transmitted disease (STD). GBS is different from Group A streptococcus, the bacteria that causes "strep throat." CAUSES  GBS bacteria can be found in the intestinal, reproductive, and urinary tracts of women. It can also be found in the female genital tract, most often in the vagina and rectal areas.  SYMPTOMS  In pregnancy, GBS can be in the following places:  Genital tract without symptoms.  Rectum without symptoms.  Urine with or without symptoms (asymptomatic bacteriuria).  Urinary symptoms can include pain, frequency, urgency, and blood with urination (cystitis). Pregnant women who are infected with GBS are at increased risk of:  Early (premature) labor and delivery.  Prolonged rupture of the membranes.  Infection in the following places:  Bladder.  Kidneys (pyelonephritis).  Bag of waters or placenta (chorioamnionitis).  Uterus (endometritis) after delivery. Newborns who are infected with GBS can develop:  Lung infection (pneumonia).  Blood infection (septicemia).  Infection of the lining of the brain and spinal cord (meningitis). DIAGNOSIS  Diagnosis of GBS infection is made by screening tests done when you are 35 to [redacted] weeks pregnant. The test (culture) is an easy swab of the vagina and rectum. A sample of your urine might also be checked for the bacteria. Talk with your caregiver about a plan for labor if your test shows that you carry the GBS bacteria. TREATMENT  If results of the culture are positive, showing that GBS is present, you likely will receive treatment with antibiotic medicines during labor. This will help prevent GBS from being passed to your baby.  The antibiotics work only if they are given during labor. If treatment is given earlier in pregnancy, the bacteria may regrow and be present during labor. Tell your caregiver if you are allergic to penicillin or other antibiotics. Antibiotics are also given if:   Infection of the membranes (amnionitis) is suspected.  Labor begins or there is rupture of the membranes before 37 weeks of pregnancy and there is a high risk of delivering the baby.  The mother has a past history of giving birth to an infant with GBS infection.  The culture status is unknown (culture not performed or result not available) and there is:  Fever during labor.  Preterm labor (less than 37 weeks of pregnancy).  Prolonged rupture of membranes (18 hours or more). Treatment of the mother during labor is not recommended when:  A planned cesarean delivery is done and there is no labor or ruptured membranes. This is true even if the mother tested positive for GBS.  There is a negative culture for GBS screening during the pregnancy, regardless of the risk factors during labor. The infant will receive antibiotics if the infant tested positive for GBS or has signs and symptoms that suggest GBS infection is present. It is not recommended to routinely give antibiotics to infants whose mothers received antibiotic treatment during labor. HOME CARE INSTRUCTIONS   Take all antibiotics as prescribed by your caregiver.  Only take medicine as directed by your caregiver.  Continue with prenatal visits and care.  Return for follow-up appointments and cultures.  Follow your caregiver's instructions. SEEK MEDICAL CARE IF:   You have pain with urination.    You have frequent urination.  You have blood in your urine. SEEK IMMEDIATE MEDICAL CARE IF:  You have a fever.  You have pain in the back, side, or uterus.  You have chills.  You have abdominal swelling (distension) or pain.  You have labor pains (contractions) every  10 minutes or more often.  You are leaking fluid or bleeding from your vagina.  You have pelvic pressure that feels like your baby is pushing down.  You have a low, dull backache.  You have cramps that feel like your period.  You have abdominal cramps with or without diarrhea.  You have repeated vomiting and diarrhea.  You have trouble breathing.  You develop confusion.  You have stiffness of your body or neck. MAKE SURE YOU:   Understand these instructions.  Will watch your condition.  Will get help right away if you are not doing well or get worse. Document Released: 05/30/2007 Document Revised: 05/15/2011 Document Reviewed: 07/03/2010 [employer] Patient Information 2014 [employer].  

## 2013-02-04 ENCOUNTER — Ambulatory Visit (INDEPENDENT_AMBULATORY_CARE_PROVIDER_SITE_OTHER): Payer: Medicaid Other | Admitting: Advanced Practice Midwife

## 2013-02-04 ENCOUNTER — Encounter: Payer: Self-pay | Admitting: Advanced Practice Midwife

## 2013-02-04 VITALS — BP 108/60 | Wt 126.0 lb

## 2013-02-04 DIAGNOSIS — O99019 Anemia complicating pregnancy, unspecified trimester: Secondary | ICD-10-CM

## 2013-02-04 DIAGNOSIS — O99330 Smoking (tobacco) complicating pregnancy, unspecified trimester: Secondary | ICD-10-CM

## 2013-02-04 DIAGNOSIS — O99340 Other mental disorders complicating pregnancy, unspecified trimester: Secondary | ICD-10-CM

## 2013-02-04 DIAGNOSIS — Z331 Pregnant state, incidental: Secondary | ICD-10-CM

## 2013-02-04 DIAGNOSIS — Z3480 Encounter for supervision of other normal pregnancy, unspecified trimester: Secondary | ICD-10-CM

## 2013-02-04 DIAGNOSIS — Z1389 Encounter for screening for other disorder: Secondary | ICD-10-CM

## 2013-02-04 LAB — POCT URINALYSIS DIPSTICK
Blood, UA: NEGATIVE
Glucose, UA: NEGATIVE
Ketones, UA: NEGATIVE

## 2013-02-04 NOTE — Progress Notes (Signed)
Having a burning sensation in lower left abdomen, likely round ligament pain. Denies vaginal bleeding and leaking of fluid. Irregular contractions 1/th/-3 GBS collected. All questions answered. F/u 1 week.

## 2013-02-04 NOTE — Addendum Note (Signed)
Addended by: Gaylyn Rong A on: 02/04/2013 04:18 PM   Modules accepted: Orders

## 2013-02-06 LAB — STREP B DNA PROBE: GBSP: NEGATIVE

## 2013-02-12 ENCOUNTER — Ambulatory Visit (INDEPENDENT_AMBULATORY_CARE_PROVIDER_SITE_OTHER): Payer: Medicaid Other | Admitting: Obstetrics & Gynecology

## 2013-02-12 ENCOUNTER — Encounter: Payer: Self-pay | Admitting: Obstetrics & Gynecology

## 2013-02-12 VITALS — BP 100/60 | Wt 126.0 lb

## 2013-02-12 DIAGNOSIS — Z1389 Encounter for screening for other disorder: Secondary | ICD-10-CM

## 2013-02-12 DIAGNOSIS — O99019 Anemia complicating pregnancy, unspecified trimester: Secondary | ICD-10-CM

## 2013-02-12 DIAGNOSIS — Z331 Pregnant state, incidental: Secondary | ICD-10-CM

## 2013-02-12 LAB — POCT URINALYSIS DIPSTICK
Glucose, UA: NEGATIVE
Ketones, UA: NEGATIVE
Nitrite, UA: NEGATIVE

## 2013-02-12 NOTE — Progress Notes (Signed)
BP weight and urine results all reviewed and noted. Patient reports good fetal movement, denies any bleeding and no rupture of membranes symptoms or regular contractions. Patient is without complaints. All questions were answered.  

## 2013-02-14 ENCOUNTER — Encounter (HOSPITAL_COMMUNITY): Payer: Self-pay | Admitting: General Practice

## 2013-02-14 ENCOUNTER — Inpatient Hospital Stay (HOSPITAL_COMMUNITY)
Admission: AD | Admit: 2013-02-14 | Discharge: 2013-02-14 | Disposition: A | Discharge status: Home / Self Care | Payer: Medicaid Other | Source: Ambulatory Visit | Attending: Obstetrics & Gynecology | Admitting: Obstetrics & Gynecology

## 2013-02-14 DIAGNOSIS — O479 False labor, unspecified: Secondary | ICD-10-CM | POA: Insufficient documentation

## 2013-02-14 LAB — AMNISURE RUPTURE OF MEMBRANE (ROM) NOT AT ARMC: Amnisure ROM: NEGATIVE

## 2013-02-14 NOTE — MAU Note (Signed)
Pt presents to MAU with c/o contractions every 5 minutes. Pt states she is tolerating the contractions well.

## 2013-02-14 NOTE — MAU Provider Note (Signed)
History     CSN: [id number]  Arrival date and time: 02/14/13 1135   First Provider Initiated Contact with Patient 02/14/13 1324      Chief Complaint  Patient presents with  . Contractions   HPI Pt is 22 yo G3P2002 at [redacted]w[redacted]d who presents after onset of contractions last night and leaking of fluid. She states that she has felt some wetness in her panties since last night. She denies a gush of fluid or enough fluid leaking to wear a pad. She is sure that she lost her mucus plug but is unsure if her membranes ruptured. Last night around 11 pm she began having some infrequent contractions about 20-25 minutes apart but when she woke up this morning they became closer together about 6 minutes apart. She states they are uncomfortable but tolerable. She denies vaginal bleeding, discharge, dysuria, vaginal itching/burning, headache, vision changes, swelling in hands/face, nausea/vomiting/diarrhea. She reports good fetal movement.  OB History   Grav Para Term Preterm Abortions TAB SAB Ect Mult Living   3 2 2       2       Past Medical History  Diagnosis Date  . Cystic fibrosis gene carrier   . Depression   . Anxiety   . Back pain in pregnancy 10/09/2012  . Anemia     Past Surgical History  Procedure Laterality Date  . Appendectomy      History reviewed. No pertinent family history.  History  Substance Use Topics  . Smoking status: Current Some Day Smoker -- 0.50 packs/day for 5 years    Types: Cigarettes  . Smokeless tobacco: Never Used  . Alcohol Use: No    Allergies: No Known Allergies  Prescriptions prior to admission  Medication Sig Dispense Refill  . ferrous sulfate 325 (65 FE) MG tablet Take 1 tablet (325 mg total) by mouth 2 (two) times daily with a meal.  60 tablet  3  . Pediatric Multiple Vit-C-FA (FLINSTONES GUMMIES OMEGA-3 DHA PO) Take by mouth. Takes 2 daily        ROS Physical Exam   Blood pressure 114/71, pulse 115, temperature 98.2 F (36.8 C), temperature  source Oral, resp. rate 18, height 5\' 2"  (1.575 m), weight 57.879 kg (127 lb 9.6 oz), last menstrual period 05/21/2012.  Physical Exam  Constitutional: She is oriented to person, place, and time. She appears well-developed and well-nourished.  HENT:  Head: Normocephalic and atraumatic.  Eyes: Conjunctivae are normal. Right eye exhibits no discharge. Left eye exhibits no discharge. No scleral icterus.  Neck: Normal range of motion. Neck supple.  Cardiovascular: Normal rate, regular rhythm, normal heart sounds and intact distal pulses.   Respiratory: Effort normal and breath sounds normal. No respiratory distress.  GI: Soft. There is no tenderness. There is no guarding.  Genitourinary: No vaginal discharge found.  Musculoskeletal: Normal range of motion. She exhibits no edema and no tenderness.  Neurological: She is alert and oriented to person, place, and time.  Skin: Skin is warm and dry.   Dilation: 3 Effacement (%): 40 Cervical Position: Middle Station: Ballotable Presentation: Vertex Exam by:: Lucila Maine, RN Toco: irregular contractions every 7-10 minutes FHR: baseline 135 bpm, mod variability, accels present, no decels  Results for orders placed during the hospital encounter of 02/14/13 (from the past 24 hour(s))  AMNISURE RUPTURE OF MEMBRANE (ROM)     Status: None   Collection Time    02/14/13  1:18 PM      Result Value Range  Amnisure ROM NEGATIVE     No cervical change on repeat exam 2 hours later MAU Course  Procedures   Assessment and Plan  Pt is 22 yo G3P2002 at [redacted]w[redacted]d who presents with false labor at term - Labor precautions given - Instructed to drink plenty of fluids and urinate often to avoid uterine irritability  Pior, Jearld Lesch 02/14/2013, 2:25 PM   I have seen this patient and agree with the above resident's note.  LEFTWICH-KIRBY, Teran Daughenbaugh Certified Nurse-Midwife

## 2013-02-18 ENCOUNTER — Encounter: Payer: Medicaid Other | Admitting: Women's Health

## 2013-04-15 IMAGING — US US TRANSVAGINAL NON-OB
1 series · 14 of 25 positions shown · non-contrast
Comparison: None.

CLINICAL DATA: Left adnexal pain

TRANSABDOMINAL AND TRANSVAGINAL ULTRASOUND OF PELVIS
TECHNIQUE: Both transabdominal and transvaginal ultrasound
examinations of the pelvis were performed. Transabdominal technique
was performed for global imaging of the pelvis including uterus,
ovaries, adnexal regions, and pelvic cul-de-sac.

[Series 1: us transvaginal non-ob · 0.21mm/px · 14 of 59 slices shown]
[im 1/59]
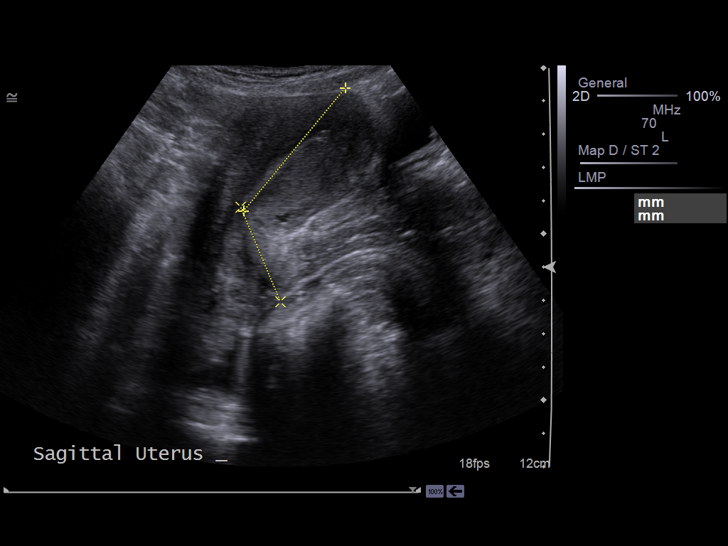
[im 5/59]
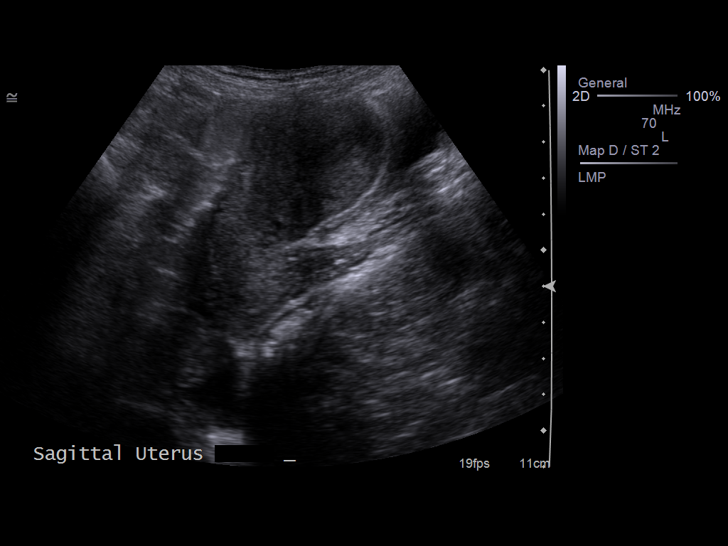
[im 10/59]
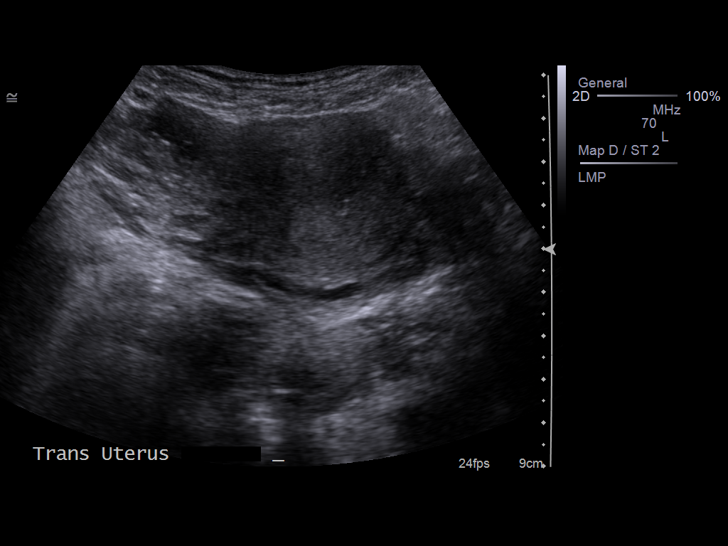
[im 15/59]
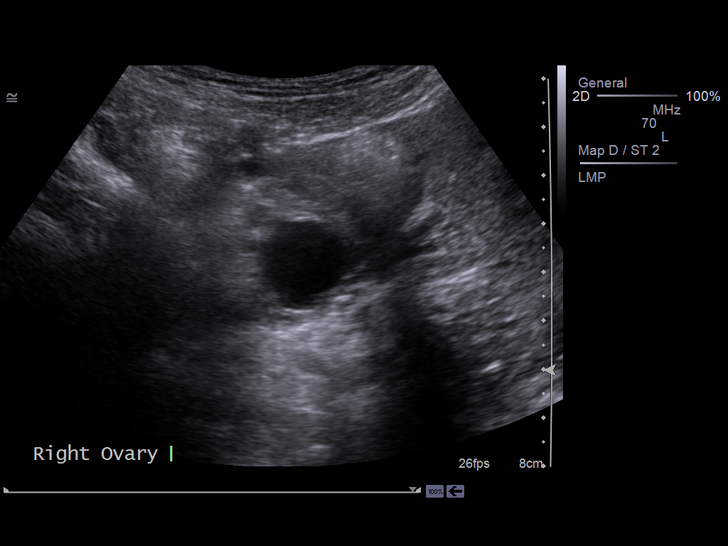
[im 20/59]
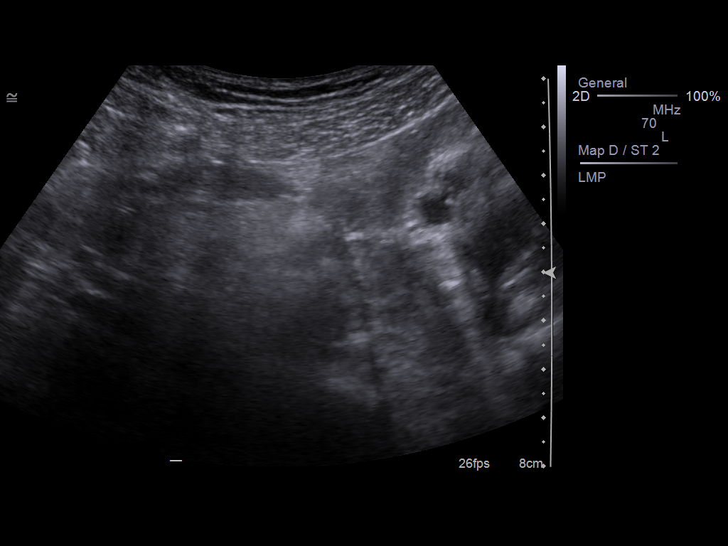
[im 22/59]
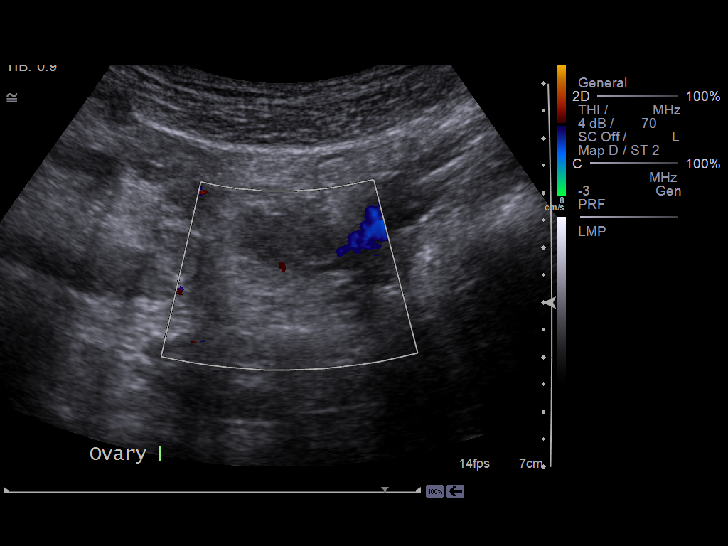
[im 27/59]
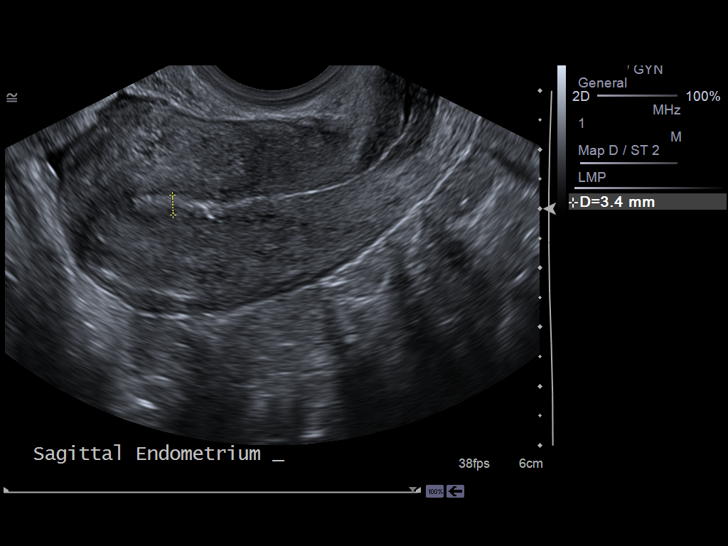
[im 32/59]
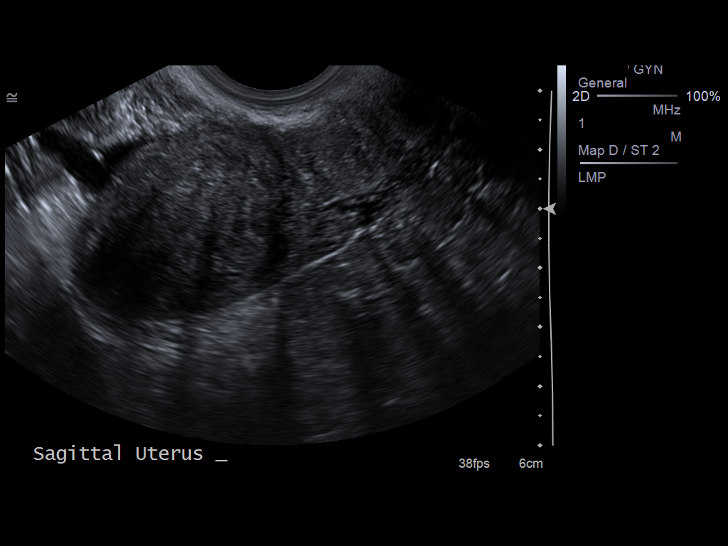
[im 37/59]
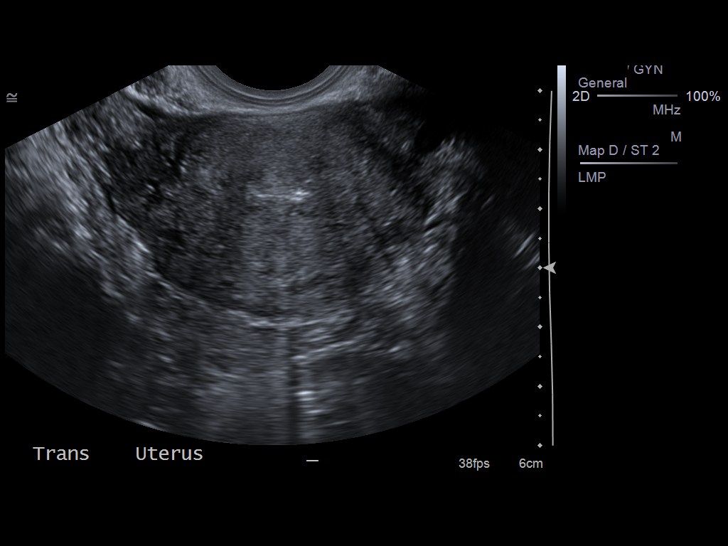
[im 39/59]
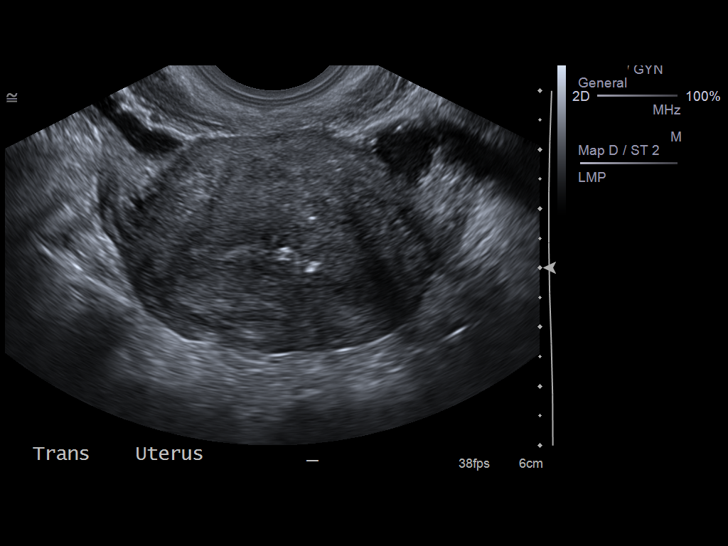
[im 44/59]
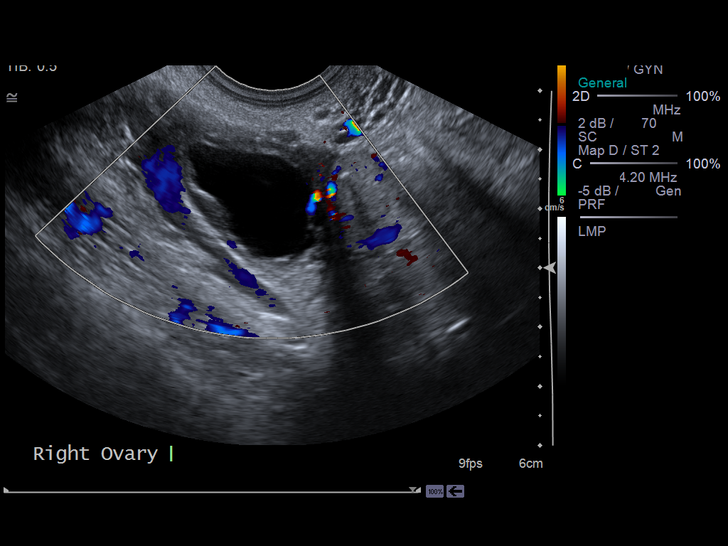
[im 49/59]
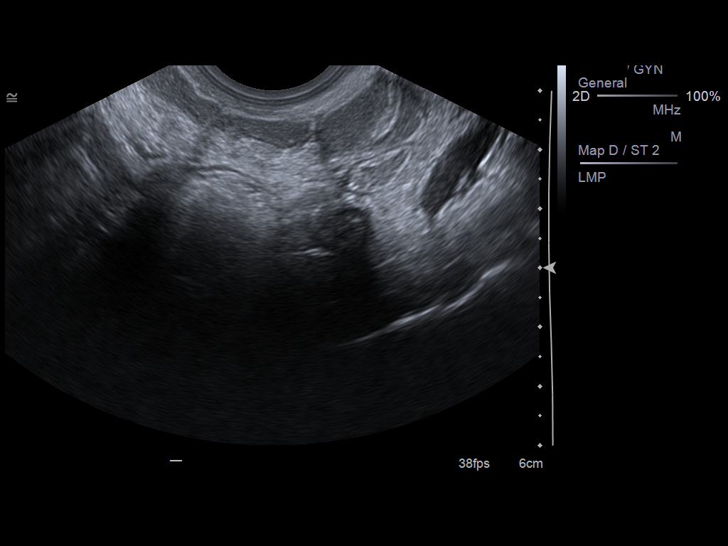
[im 54/59]
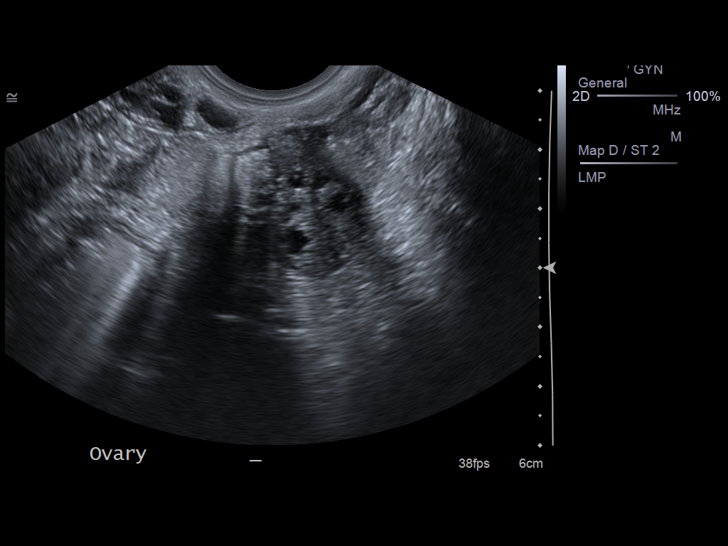
[im 59/59]
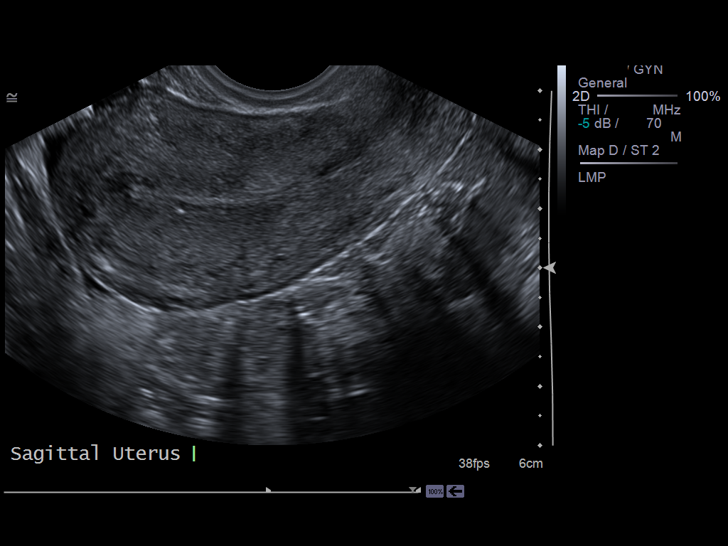

[14 of 25 positions shown; findings below may reference images not displayed]

It was necessary to proceed with endovaginal exam following the
transabdominal exam to visualize the endometrium.
FINDINGS: Uterus: Normal in size and appearance, measuring 7.2 x 3.5 x
cm.

Endometrium: Normal in thickness and appearance, measuring 3 mm.

Right ovary:  Measures 4.4 x 2.2 x 2.4 cm and is notable for a
x 2.6 x 1.7 cm cyst.

Left ovary: Normal appearance/no adnexal mass, measuring 2.8 x
x 1.4 cm.

Other findings: Small volume pelvic ascites.
IMPRESSION: Normal study.  No evidence of pelvic mass or other significant
abnormality.

## 2013-06-06 ENCOUNTER — Encounter (HOSPITAL_COMMUNITY): Payer: Self-pay | Admitting: *Deleted

## 2013-08-10 IMAGING — US US PELVIS COMPLETE
1 series · 13 of 25 positions shown · non-contrast
Comparison: 03/29/2011

CLINICAL DATA: Right lower quadrant pain question ovarian torsion

TRANSABDOMINAL AND TRANSVAGINAL ULTRASOUND OF PELVIS
DOPPLER ULTRASOUND OF OVARIES
TECHNIQUE: Both transabdominal and transvaginal ultrasound
examinations of the pelvis were performed. Transabdominal technique
was performed for global imaging of the pelvis including uterus,
ovaries, adnexal regions, and pelvic cul-de-sac.
It was necessary to proceed with endovaginal exam following the
transabdominal exam to visualize the left and right adnexae for
assessment of blood flow within the ovaries.
Color and duplex Doppler ultrasound was utilized to evaluate blood
flow to the ovaries.

[Series 1: us pelvis complete · 0.20mm/px · 13 of 96 slices shown]
[im 1/96]
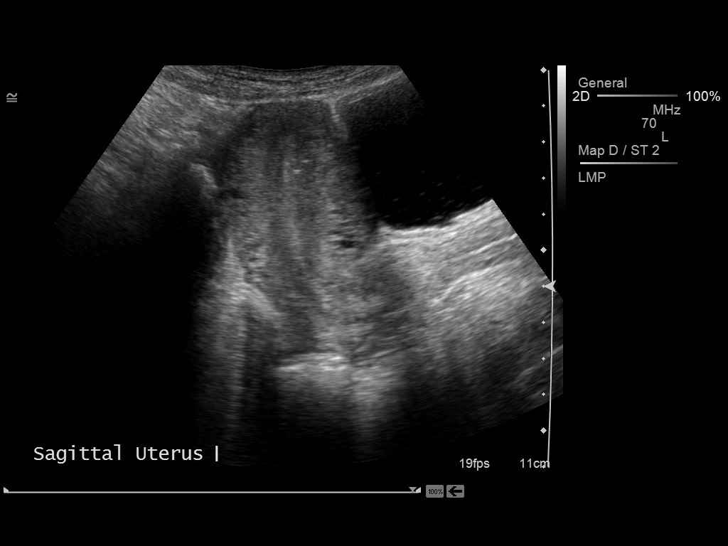
[im 8/96]
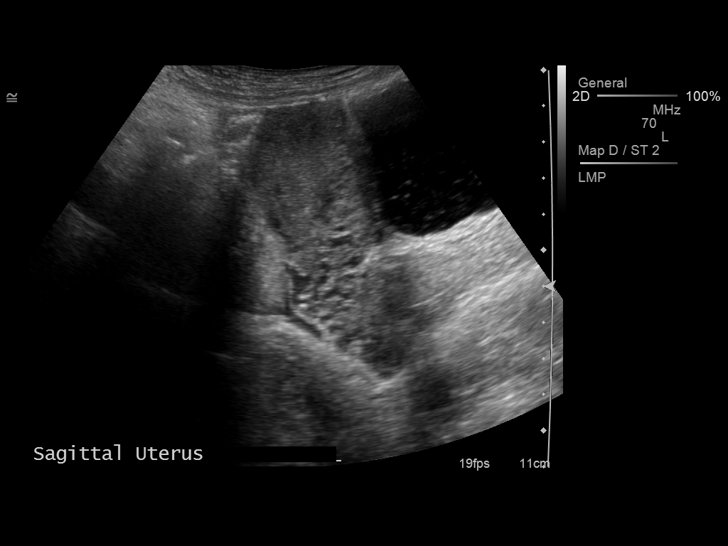
[im 16/96]
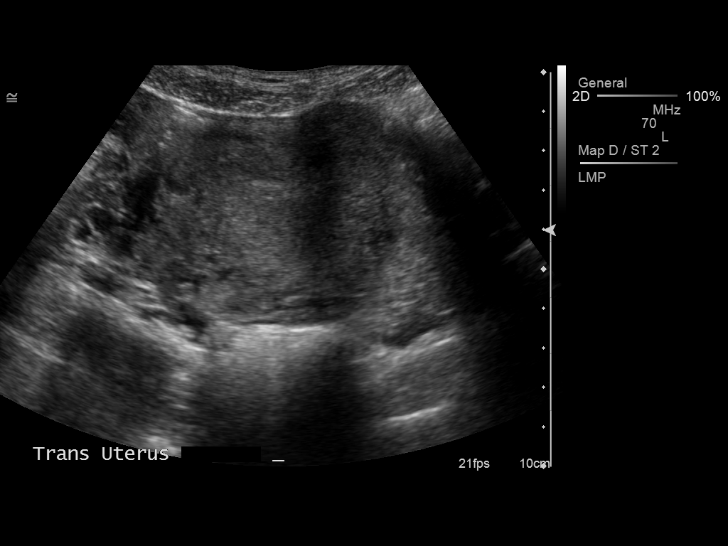
[im 24/96]
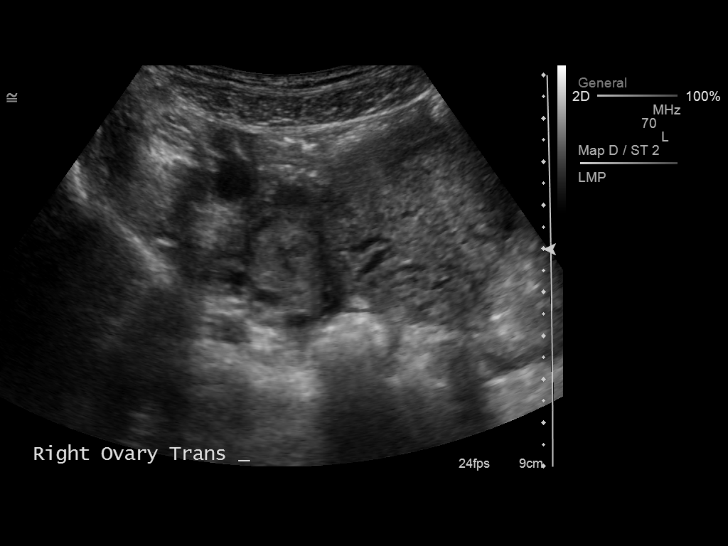
[im 32/96]
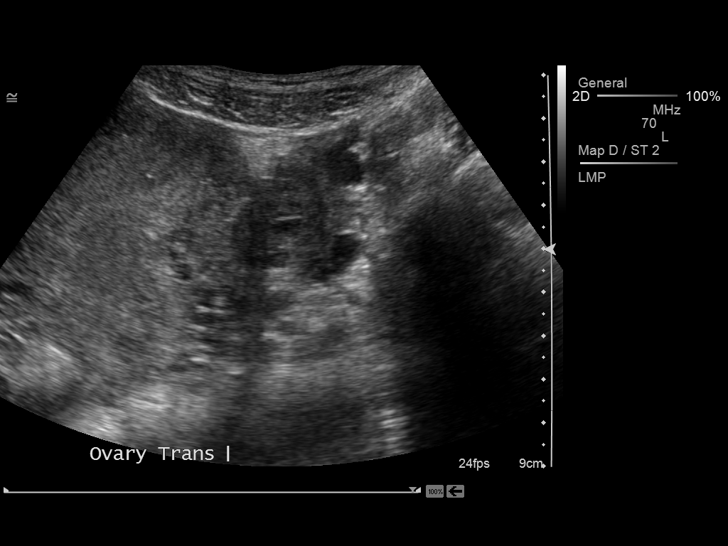
[im 40/96]
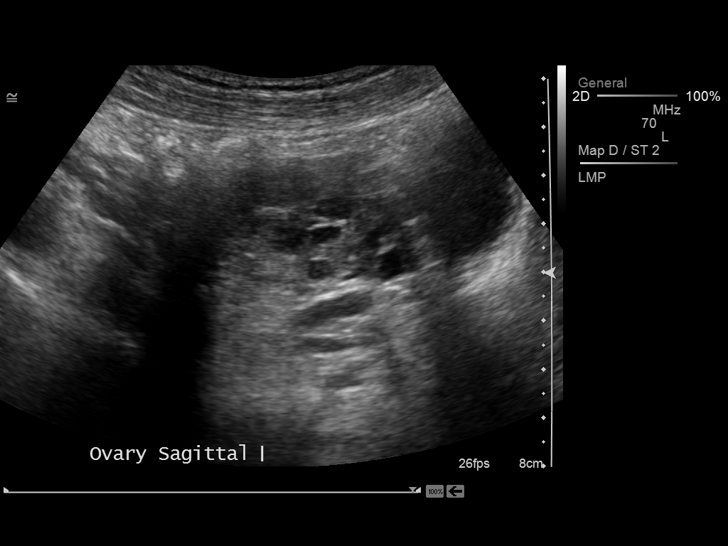
[im 48/96]
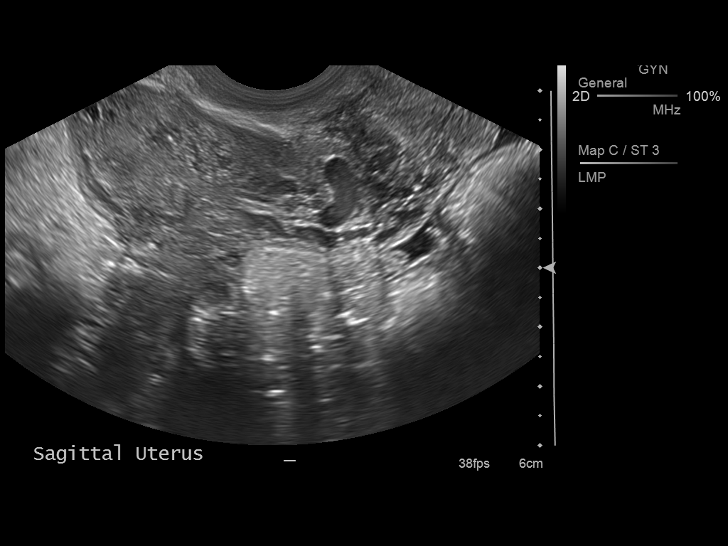
[im 56/96]
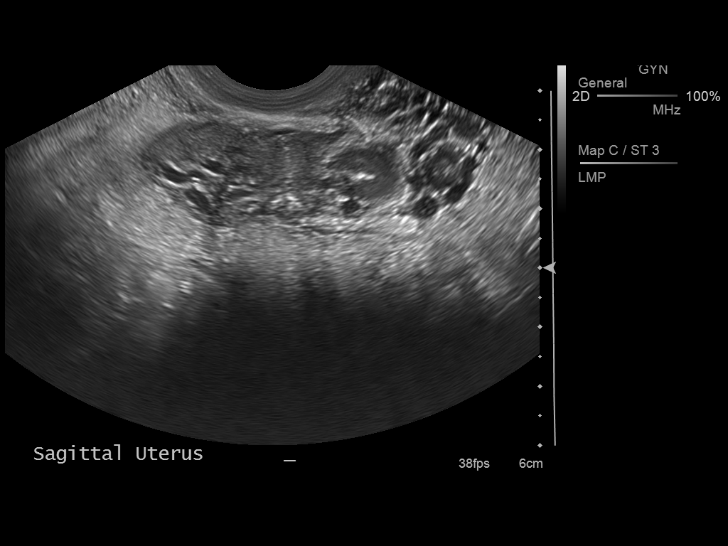
[im 64/96]
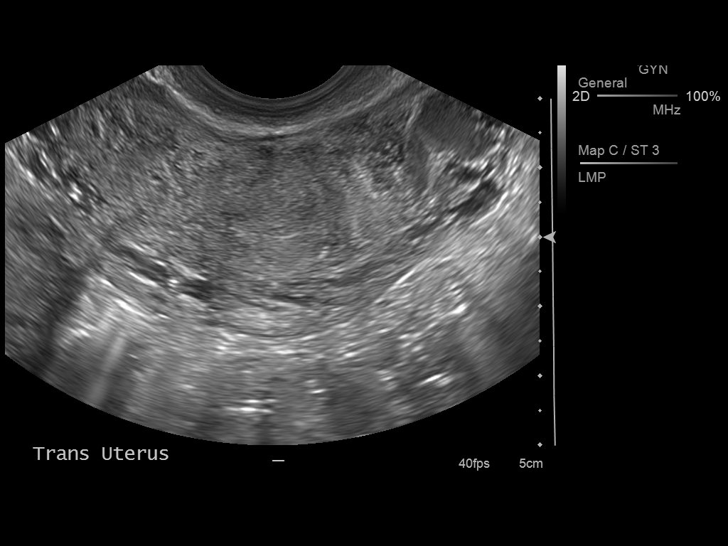
[im 72/96]
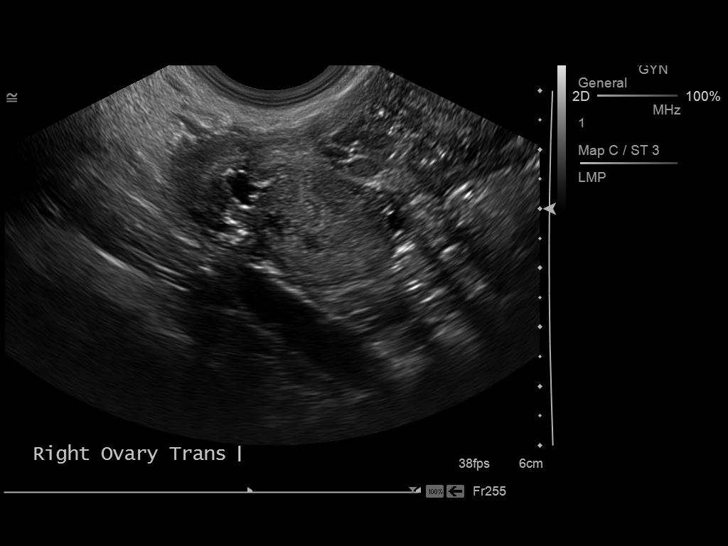
[im 80/96]
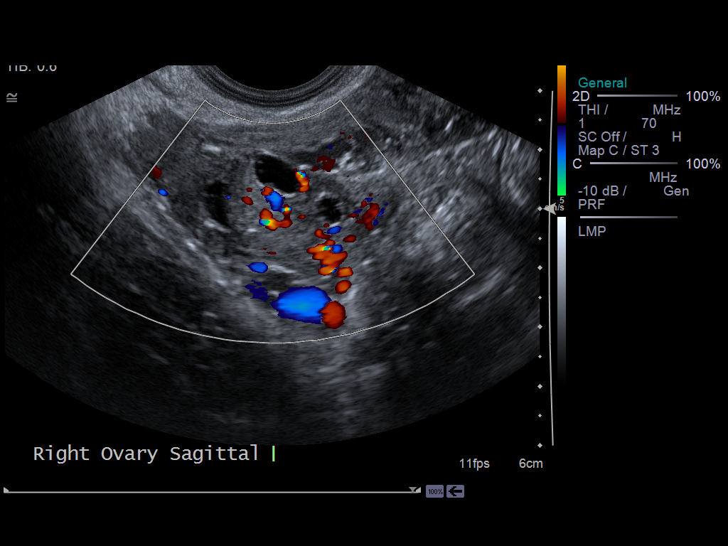
[im 88/96]
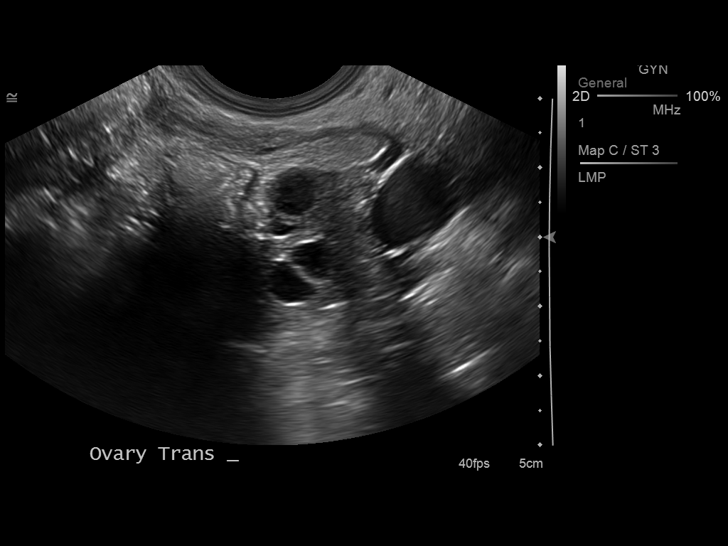
[im 96/96]
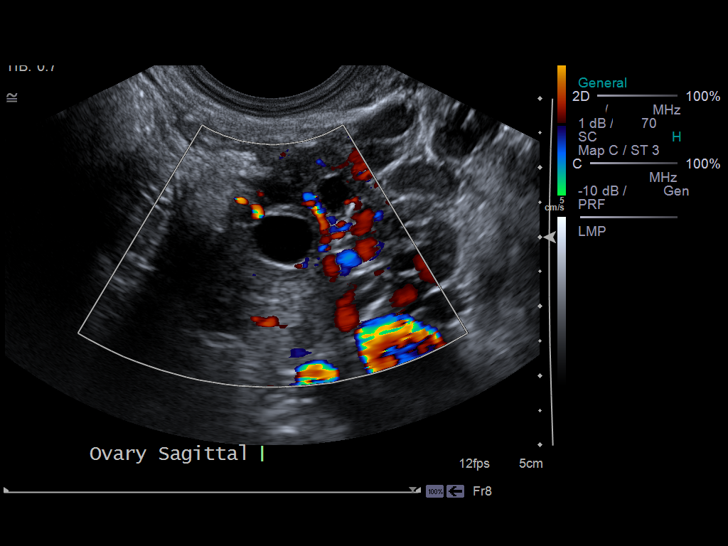

[13 of 25 positions shown; findings below may reference images not displayed]

FINDINGS: Uterus:  Anteverted, 8.6 cm length by 3.3 cm AP by 6.4 cm
transverse.  Mild heterogeneity of myometrial echogenicity.
Several tiny nonshadowing echogenic foci are seen within the
myometrium, also noted on previous exam, question tiny
calcifications.  No focal mass lesions.  Cervix unremarkable.

Endometrium:  4 mm diameter, normal in thickness.  No endometrial
fluid.

Right ovary: 3.3 x 1.9 x 2.4 cm.  Normal-appearing follicles cysts
within right ovary.  No dominant solid or cystic mass.  Internal
blood flow present within right ovary on color Doppler imaging.

Left ovary: 3.0 x 1.8 x 1.9 cm.  Scattered normal-appearing
follicles within left ovary.  No dominant solid or cystic mass.
Internal blood flow present within left ovary on color Doppler
imaging.

Pulsed Doppler evaluation demonstrates normal low-resistance
arterial and venous waveforms in both ovaries.

Other findings: Trace free pelvic fluid in cul-de-sac.
IMPRESSION: Nonspecific trace free pelvic fluid.
No pelvic mass or additional pelvic abnormality identified.
Specifically, no sonographic evidence for ovarian torsion.

## 2014-01-05 ENCOUNTER — Encounter (HOSPITAL_COMMUNITY): Payer: Self-pay | Admitting: *Deleted

## 2014-06-06 ENCOUNTER — Emergency Department (HOSPITAL_COMMUNITY)
Admission: EM | Admit: 2014-06-06 | Discharge: 2014-06-06 | Disposition: A | Discharge status: Home / Self Care | Payer: Self-pay | Attending: Emergency Medicine | Admitting: Emergency Medicine

## 2014-06-06 ENCOUNTER — Encounter (HOSPITAL_COMMUNITY): Payer: Self-pay

## 2014-06-06 DIAGNOSIS — Z7952 Long term (current) use of systemic steroids: Secondary | ICD-10-CM | POA: Insufficient documentation

## 2014-06-06 DIAGNOSIS — Z8659 Personal history of other mental and behavioral disorders: Secondary | ICD-10-CM | POA: Insufficient documentation

## 2014-06-06 DIAGNOSIS — Z720 Tobacco use: Secondary | ICD-10-CM | POA: Insufficient documentation

## 2014-06-06 DIAGNOSIS — Z862 Personal history of diseases of the blood and blood-forming organs and certain disorders involving the immune mechanism: Secondary | ICD-10-CM | POA: Insufficient documentation

## 2014-06-06 DIAGNOSIS — M5431 Sciatica, right side: Secondary | ICD-10-CM | POA: Insufficient documentation

## 2014-06-06 DIAGNOSIS — Z79899 Other long term (current) drug therapy: Secondary | ICD-10-CM | POA: Insufficient documentation

## 2014-06-06 LAB — URINE MICROSCOPIC-ADD ON

## 2014-06-06 LAB — URINALYSIS, ROUTINE W REFLEX MICROSCOPIC
Bilirubin Urine: NEGATIVE
Glucose, UA: NEGATIVE mg/dL
Ketones, ur: NEGATIVE mg/dL
Leukocytes, UA: NEGATIVE
Nitrite: NEGATIVE
Protein, ur: NEGATIVE mg/dL
Specific Gravity, Urine: 1.025 (ref 1.005–1.030)
Urobilinogen, UA: 0.2 mg/dL (ref 0.0–1.0)
pH: 6 (ref 5.0–8.0)

## 2014-06-06 MED ORDER — PREDNISONE 50 MG PO TABS: ORAL_TABLET | ORAL | Status: DC

## 2014-06-06 MED ORDER — HYDROCODONE-ACETAMINOPHEN 5-325 MG PO TABS: 1.0000 | ORAL_TABLET | ORAL | Status: DC | PRN

## 2014-06-06 MED ORDER — CYCLOBENZAPRINE HCL 10 MG PO TABS
10.0000 mg | ORAL_TABLET | Freq: Once | ORAL | Status: AC
  Administered 2014-06-06: 10 mg via ORAL
  Filled 2014-06-06: qty 1

## 2014-06-06 MED ORDER — CYCLOBENZAPRINE HCL 10 MG PO TABS: 10.0000 mg | ORAL_TABLET | Freq: Two times a day (BID) | ORAL | Status: DC | PRN

## 2014-06-06 MED ORDER — HYDROCODONE-ACETAMINOPHEN 5-325 MG PO TABS
1.0000 | ORAL_TABLET | Freq: Once | ORAL | Status: AC
  Administered 2014-06-06: 1 via ORAL
  Filled 2014-06-06: qty 1

## 2014-06-06 MED ORDER — PREDNISONE 50 MG PO TABS
60.0000 mg | ORAL_TABLET | Freq: Once | ORAL | Status: AC
  Administered 2014-06-06: 15:00:00 60 mg via ORAL
  Filled 2014-06-06 (×2): qty 1

## 2014-06-06 NOTE — ED Provider Notes (Signed)
CSN: [id number]     Arrival date & time 06/06/14  1352 History   First MD Initiated Contact with Patient 06/06/14 1407     Chief Complaint  Patient presents with  . Back Pain     (Consider location/radiation/quality/duration/timing/severity/associated sxs/prior Treatment) Patient is a 24 y.o. female presenting with back pain. The history is provided by the patient.  Back Pain Location:  Lumbar spine Quality:  Aching and shooting Radiates to:  R posterior upper leg Pain severity:  Severe Pain is:  Same all the time Onset quality:  Gradual Timing:  Constant Progression:  Worsening Chronicity:  New Relieved by:  Nothing Worsened by:  Ambulation, movement and bending Associated symptoms: no bladder incontinence and no bowel incontinence    Jeanne Hernandez is a 24 y.o. female who presents to the ED with low back pain. She states that last night she helped her husband move a queen bed with the mattress and box springs. This morning when she got out of bed she had severe lower back pain that radiates down the right leg. She has continued to have the pain all day. She denies any other problems.    Past Medical History  Diagnosis Date  . Cystic fibrosis gene carrier   . Depression   . Anxiety   . Back pain in pregnancy 10/09/2012  . Anemia    Past Surgical History  Procedure Laterality Date  . Appendectomy     History reviewed. No pertinent family history. History  Substance Use Topics  . Smoking status: Current Some Day Smoker -- 0.50 packs/day for 5 years    Types: Cigarettes  . Smokeless tobacco: Never Used  . Alcohol Use: No   OB History    Gravida Para Term Preterm AB TAB SAB Ectopic Multiple Living   Review of Systems  Gastrointestinal: Negative for bowel incontinence.  Genitourinary: Negative for bladder incontinence.  Musculoskeletal: Positive for back pain.  all other systems negative    Allergies  Review of patient's allergies indicates no  known allergies.  Home Medications   Prior to Admission medications   Medication Sig Start Date End Date Taking? Authorizing Provider  cyclobenzaprine (FLEXERIL) 10 MG tablet Take 1 tablet (10 mg total) by mouth 2 (two) times daily as needed for muscle spasms. 06/06/14   Elena Davia Orlene Och, NP  ferrous sulfate 325 (65 FE) MG tablet Take 1 tablet (325 mg total) by mouth 2 (two) times daily with a meal. 12/20/12   Cheral Marker, CNM  HYDROcodone-acetaminophen (NORCO/VICODIN) 5-325 MG per tablet Take 1 tablet by mouth every 4 (four) hours as needed. 06/06/14   Kyle Luppino Orlene Och, NP  Pediatric Multiple Vit-C-FA (FLINSTONES GUMMIES OMEGA-3 DHA PO) Take by mouth. Takes 2 daily    Historical Provider, MD  predniSONE (DELTASONE) 50 MG tablet Take one tablet daily starting 06/07/14 06/06/14   Brihana Quickel Orlene Och, NP   BP 105/66 mmHg  Pulse 65  Temp(Src) 98.1 F (36.7 C) (Oral)  Resp 16  Ht  (1.575 m)  Wt 121 lb (54.885 kg)  BMI 22.13 kg/m2  SpO2 99%  LMP 06/05/2014 Physical Exam  Constitutional: She is oriented to person, place, and time. She appears well-developed and well-nourished. No distress.  HENT:  Head: Normocephalic and atraumatic.  Right Ear: Tympanic membrane normal.  Left Ear: Tympanic membrane normal.  Nose: Nose normal.  Mouth/Throat: Uvula is midline, oropharynx is clear and moist  and mucous membranes are normal.  Eyes: EOM are normal.  Neck: Normal range of motion. Neck supple.  Cardiovascular: Normal rate and regular rhythm.   Pulmonary/Chest: Effort normal. She has no wheezes. She has no rales.  Abdominal: Soft. Bowel sounds are normal. There is no tenderness.  Musculoskeletal: Normal range of motion.       Lumbar back: She exhibits tenderness, pain and spasm. She exhibits normal pulse. Decreased range of motion: due to pain.       Back:  Neurological: She is alert and oriented to person, place, and time. She has normal strength. No cranial nerve deficit or sensory deficit. Gait  normal.  Reflex Scores:      Bicep reflexes are 2+ on the right side and 2+ on the left side.      Brachioradialis reflexes are 2+ on the right side and 2+ on the left side.      Patellar reflexes are 2+ on the right side and 2+ on the left side.      Achilles reflexes are 2+ on the right side and 2+ on the left side. Ambulatory without foot drag  Skin: Skin is warm and dry.  Psychiatric: She has a normal mood and affect. Her behavior is normal.  Nursing note and vitals reviewed.   ED Course  Procedures (including critical care time) Labs Review Results for orders placed or performed during the hospital encounter of 06/06/14 (from the past 24 hour(s))  Urinalysis, Routine w reflex microscopic     Status: Abnormal   Collection Time: 06/06/14  2:15 PM  Result Value Ref Range   Color, Urine YELLOW YELLOW   APPearance CLEAR CLEAR   Specific Gravity, Urine 1.025 1.005 - 1.030   pH 6.0 5.0 - 8.0   Glucose, UA NEGATIVE NEGATIVE mg/dL   Hgb urine dipstick SMALL (A) NEGATIVE   Bilirubin Urine NEGATIVE NEGATIVE   Ketones, ur NEGATIVE NEGATIVE mg/dL   Protein, ur NEGATIVE NEGATIVE mg/dL   Urobilinogen, UA 0.2 0.0 - 1.0 mg/dL   Nitrite NEGATIVE NEGATIVE   Leukocytes, UA NEGATIVE NEGATIVE  Urine microscopic-add on     Status: Abnormal   Collection Time: 06/06/14  2:15 PM  Result Value Ref Range   Squamous Epithelial / LPF MANY (A) RARE   WBC, UA 0-2 <3 WBC/hpf   RBC / HPF 3-6 <3 RBC/hpf   Bacteria, UA RARE RARE    Patient improved dramatically after Prednisone, flexeril and hydrocodone.  MDM  24 y.o. female with low back pain that radiates to the right buttock s/p physical strain yesterday. Stable for d/c without focal neuro deficits. Discussed with the patient clinical findings and plan of care. Discussed that if symptoms worsen she may need MRI for evaluation of disc injury. Patient voices understanding. All questioned fully answered. She will call me if any problems arise.   Final  diagnoses:  Sciatica, right      [facility] LLCope M Braylyn Eye, NP 06/06/14 1757  Donnetta HutchingBrian Cook, MD 06/09/14 1255

## 2014-06-06 NOTE — ED Notes (Signed)
Patient with no complaints at this time. Respirations even and unlabored. Skin warm/dry. Discharge instructions reviewed with patient at this time. Patient given opportunity to voice concerns/ask questions. Patient discharged at this time and left Emergency Department with steady gait.   

## 2014-06-06 NOTE — Discharge Instructions (Signed)
Take the medication as directed. Do not drive while taking the narcotic or the muscle relaxant because they will make you sleepy. Follow up with Dr. Hilda LiasKeeling if symptoms persist. Return here as needed.

## 2014-06-06 NOTE — ED Notes (Signed)
Patient c/o of right lower back, right flank pain upon awakening. Denies urinary symptoms.

## 2014-06-06 NOTE — ED Notes (Signed)
Lower lumbar pain began this morning.  Sharp, radiating bilaterally into thighs, occasionally causing her legs to almost buckle.

## 2014-09-06 ENCOUNTER — Emergency Department (HOSPITAL_COMMUNITY)
Admission: EM | Admit: 2014-09-06 | Discharge: 2014-09-06 | Disposition: A | Discharge status: Home / Self Care | Payer: Self-pay | Attending: Emergency Medicine | Admitting: Emergency Medicine

## 2014-09-06 ENCOUNTER — Encounter (HOSPITAL_COMMUNITY): Payer: Self-pay | Admitting: *Deleted

## 2014-09-06 DIAGNOSIS — Z79899 Other long term (current) drug therapy: Secondary | ICD-10-CM | POA: Insufficient documentation

## 2014-09-06 DIAGNOSIS — Z8659 Personal history of other mental and behavioral disorders: Secondary | ICD-10-CM | POA: Insufficient documentation

## 2014-09-06 DIAGNOSIS — L03312 Cellulitis of back [any part except buttock]: Secondary | ICD-10-CM | POA: Insufficient documentation

## 2014-09-06 DIAGNOSIS — D649 Anemia, unspecified: Secondary | ICD-10-CM | POA: Insufficient documentation

## 2014-09-06 DIAGNOSIS — Z7952 Long term (current) use of systemic steroids: Secondary | ICD-10-CM | POA: Insufficient documentation

## 2014-09-06 DIAGNOSIS — L03811 Cellulitis of head [any part, except face]: Secondary | ICD-10-CM | POA: Insufficient documentation

## 2014-09-06 DIAGNOSIS — Z720 Tobacco use: Secondary | ICD-10-CM | POA: Insufficient documentation

## 2014-09-06 DIAGNOSIS — Z141 Cystic fibrosis carrier: Secondary | ICD-10-CM | POA: Insufficient documentation

## 2014-09-06 MED ORDER — CEPHALEXIN 500 MG PO CAPS: 500.0000 mg | ORAL_CAPSULE | Freq: Four times a day (QID) | ORAL | Status: DC

## 2014-09-06 NOTE — ED Notes (Signed)
Pt states she had a bump on the lower back and her husband popped it and a maggot came out. She stated that swollen areas to her head with sores and states "puss and stuff was coming out of areas, and he found 3 maggots in my head."  Pt states swollen lymph node to left neck. Drainage, itching, and redness to right eye since last night. Pt states she wants to make sure there ar no more maggots in her head.

## 2014-09-06 NOTE — ED Provider Notes (Signed)
CSN: [id number]     Arrival date & time 09/06/14  1722 History   First MD Initiated Contact with Patient 09/06/14 1827     Chief Complaint  Patient presents with  . Multiple complaints        HPI  Pt was seen at 1845.  Per pt, c/o gradual onset and resolution of multiple scattered areas of "bumps" to her back, forehead and scalp that she noticed yesterday. Pt states the areas were "red bumps with a white head on them." Pt states her husband "popped them" and "maggots came out." Pt states she also "started new hormone pills" and "was wondering if that has anything to do with it." Denies fevers, no foreign travel.   Past Medical History  Diagnosis Date  . Cystic fibrosis gene carrier   . Depression   . Anxiety   . Back pain in pregnancy 10/09/2012  . Anemia    Past Surgical History  Procedure Laterality Date  . Appendectomy      History  Substance Use Topics  . Smoking status: Current Some Day Smoker -- 0.50 packs/day for 5 years    Types: Cigarettes  . Smokeless tobacco: Never Used  . Alcohol Use: No   OB History    Gravida Para Term Preterm AB TAB SAB Ectopic Multiple Living   3 2 2       2      Review of Systems ROS: Statement: All systems negative except as marked or noted in the HPI; Constitutional: Negative for fever and chills. ; ; Eyes: Negative for eye pain, redness and discharge. ; ; ENMT: Negative for ear pain, hoarseness, nasal congestion, sinus pressure and sore throat. ; ; Cardiovascular: Negative for chest pain, palpitations, diaphoresis, dyspnea and peripheral edema. ; ; Respiratory: Negative for cough, wheezing and stridor. ; ; Gastrointestinal: Negative for nausea, vomiting, diarrhea, abdominal pain, blood in stool, hematemesis, jaundice and rectal bleeding. . ; ; Genitourinary: Negative for dysuria, flank pain and hematuria. ; ; Musculoskeletal: Negative for back pain and neck pain. Negative for swelling and trauma.; ; Skin: Negative for pruritus, rash, abrasions,  blisters, bruising and +skin lesions.; ; Neuro: Negative for headache, lightheadedness and neck stiffness. Negative for weakness, altered level of consciousness , altered mental status, extremity weakness, paresthesias, involuntary movement, seizure and syncope.     Allergies  Review of patient's allergies indicates no known allergies.  Home Medications   Prior to Admission medications   Medication Sig Start Date End Date Taking? Authorizing Provider  cyclobenzaprine (FLEXERIL) 10 MG tablet Take 1 tablet (10 mg total) by mouth 2 (two) times daily as needed for muscle spasms. Patient not taking: Reported on 09/06/2014 06/06/14   Janne NapoleonHope M Neese, NP  ferrous sulfate 325 (65 FE) MG tablet Take 1 tablet (325 mg total) by mouth 2 (two) times daily with a meal. Patient not taking: Reported on 09/06/2014 12/20/12   Cheral MarkerKimberly R Booker, CNM  HYDROcodone-acetaminophen (NORCO/VICODIN) 5-325 MG per tablet Take 1 tablet by mouth every 4 (four) hours as needed. Patient not taking: Reported on 09/06/2014 06/06/14   Janne NapoleonHope M Neese, NP  predniSONE (DELTASONE) 50 MG tablet Take one tablet daily starting 06/07/14 Patient not taking: Reported on 09/06/2014 06/06/14   Janne NapoleonHope M Neese, NP   BP 125/64 mmHg  Pulse 75  Temp(Src) 98 F (36.7 C) (Oral)  Resp 18  Ht 5' 2.5" (1.588 m)  Wt 110 lb (49.896 kg)  BMI 19.79 kg/m2  SpO2 100%  LMP 08/30/2014 Physical Exam  1850: Physical examination:  Nursing notes reviewed; Vital signs and O2 SAT reviewed;  Constitutional: Well developed, Well nourished, Well hydrated, In no acute distress; Head:  Normocephalic, atraumatic; Eyes: EOMI, PERRL, No scleral icterus. No conjunctival injection.; ENMT: Mouth and pharynx normal, Mucous membranes moist; Neck: Supple, Full range of motion; Cardiovascular: Regular rate and rhythm; Respiratory: Breath sounds clear, No wheezes.  Speaking full sentences with ease, Normal respiratory effort/excursion; Chest: No deformity, Movement normal; Abdomen:  Nondistended, Normal bowel sounds; Genitourinary: No CVA tenderness; Extremities: Pulses normal, No deformity, No edema.; Neuro: AA&Ox3, Major CN grossly intact.  Speech clear. No gross focal motor deficits in extremities. Climbs on and off stretcher easily by herself. Gait steady.; Skin: Color normal, Warm, Dry. +scattered acne to face, forehead, hairline. +mild erythema with overlying scabs to scattered multiple areas of scalp and back. No areas of fluctuance, no drainage.     ED Course  Procedures     EKG Interpretation None      MDM  MDM Reviewed: previous chart, nursing note and vitals     1910:  Pt has opened up multiple areas of abscesses. All are mildly erythematous, none have drainage. Will tx with abx. Long d/w pt by myself and ED RN regarding abscesses, treatment, etc. Pt verb understanding. States she "feels better now knowing they weren't maggots." Pt states she wants to get dressed and go home now. Dx and testing d/w pt.  Questions answered.  Verb understanding, agreeable to d/c home with outpt f/u.   Samuel Jester, DO 09/09/14 1117

## 2014-09-06 NOTE — Discharge Instructions (Signed)
°Emergency Department Resource Guide °1) Find a Doctor and Pay Out of Pocket °Although you won't have to find out who is covered by your insurance plan, it is a good idea to ask around and get recommendations. You will then need to call the office and see if the doctor you have chosen will accept you as a new patient and what types of options they offer for patients who are self-pay. Some doctors offer discounts or will set up payment plans for their patients who do not have insurance, but you will need to ask so you aren't surprised when you get to your appointment. ° °2) Contact Your Local Health Department °Not all health departments have doctors that can see patients for sick visits, but many do, so it is worth a call to see if yours does. If you don't know where your local health department is, you can check in your phone book. The CDC also has a tool to help you locate your state's health department, and many state websites also have listings of all of their local health departments. ° °3) Find a Walk-in Clinic °If your illness is not likely to be very severe or complicated, you may want to try a walk in clinic. These are popping up all over the country in pharmacies, drugstores, and shopping centers. They're usually staffed by nurse practitioners or physician assistants that have been trained to treat common illnesses and complaints. They're usually fairly quick and inexpensive. However, if you have serious medical issues or chronic medical problems, these are probably not your best option. ° °No Primary Care Doctor: °- Call [facility] at  832-8000 - they can help you locate a primary care doctor that  accepts your insurance, provides certain services, etc. °- Physician Referral Service- 1-800-533-3463 ° °Chronic Pain Problems: °Organization         Address  Phone   Notes  °[facility]  ([phone]) 297-2271 Patients need to be referred by their primary care doctor.  ° °Medication  Assistance: °Organization         Address  Phone   Notes  °[place] [facility] [address] °[place], [place] [address] (336) 641-8030 --Must be a resident of [place] °-- Must have NO insurance coverage whatsoever (no Medicaid/ Medicare, etc.) °-- The pt. MUST have a primary care doctor that directs their care regularly and follows them in the community °  °[facility]  (866) 331-1348   °[employer]  (888) 892-1162   ° °Agencies that provide inexpensive medical care: °Organization         Address  Phone   Notes  °[facility]  (336) 832-8035   °[facility]    (336) 832-7272   °[facility] [address] °[place], [place] [address] (336) 832-4777   °[facility] [address], °[place] (336) 271-4999   °[facility]    (336) 373-0678   °[facility]    (336) 272-1050   °[facility] ° [address], [place] Phone:  (336) 832-4444, Fax:  (336) 832-4440 Hours of Operation:  9 am - 6 pm, M-F.  Also accepts Medicaid/Medicare and self-pay.  °[facility] ° [address], [place] Phone: (336) 832-3150, Fax: (336) [phone]. Hours of Operation:  8:30 am - 5:30 pm, M-F.  Also accepts Medicaid and self-pay.  °[facility] [place] [address], [place] Phone: (336) 878-6027   °[facility] [address], [place], [place] (336)723-1848, Ext. 123 Mondays & Thursdays: 7-9 AM.  First 15 patients are seen on a first come, first serve basis. °  ° °Medicaid-accepting [place] Providers: ° °Organization         Address  Phone   Notes  °[facility] [address], [place] (336) 641-2100 Also accepts self-pay patients.  °[facility] [address], [place] ° (336) 856-9996   °[facility] [address], [place]  (336) 288-8857   °[facility] [address] [place] [address], [place] (336) 299-7000   °Veita Bland [address], [place]  ° (336) 373-1557 Only accepts [place] Access Medicaid patients after they have their name applied to their card.  ° °Self-Pay (no insurance) in [place]: ° °Organization         Address  Phone   Notes  °Sickle Cell Patients, [facility] [address], [place] (336) 832-1970   °[facility] [address], [place] (336) 832-4400   °[facility] ° [address], [place] (336) 992-4800   °[facility]/Dr. Osei-Bonsu ° [address] [place] [address], [place] or [address], [place] (336) 841-8500 Phone number for both [place] and [place] locations is the same.  °[facility] [address], [place] (336) 299-0000   °[facility] [address] [place] [address], [place] or [address] (336) 852-7530 °(336) 878-2260   °[facility] [address], [place] (336) 350-1642, phone; (336) 294-5005, fax Sees patients 1st and 3rd Saturday of every month.  Must not qualify for public or private insurance (i.e. Medicaid, Medicare, [place] Health Choice, Veterans' Benefits) • Household income should be no more than 200% of the poverty level •The clinic cannot treat you if you are pregnant or think you are pregnant • Sexually transmitted diseases are not treated at the clinic.  ° ° °Dental Care: °Organization         Address  Phone  Notes  °[place] [facility] [address], [place] (336) 641-6152 Accepts children up to age 21 who are enrolled in Medicaid or [place] Health Choice; pregnant women with a Medicaid card; and children who have applied for Medicaid or [place] Health Choice, but were declined, whose parents can pay a reduced fee at time of service.  °[place]  [facility] [place]  [address], [place] (336) 641-7733 Accepts children up to age 21 who are enrolled in Medicaid or [place] Health Choice; pregnant women with a Medicaid card; and children who have applied for Medicaid or [place] Health Choice, but were declined, whose parents can pay a reduced fee at time of service.  °[facility] ° [address], [place] (336) 641-4533 Patients are seen by appointment only. Walk-ins are not accepted. [facility] will see patients 18 years of age and older. °Monday - Tuesday (8am-5pm) °Most Wednesdays (8:30-5pm) °$30 per visit, cash only  °[facility] ° [address], [place] (336) 641-4533 Patients are seen by appointment only. Walk-ins are not accepted. [facility] will see patients 18 years of age and older. °One   Wednesday Evening (Monthly: Volunteer Based).  $30 per visit, cash only  °[facility]  (919) 537-3737 for adults; Children under age 4, call Graduate Pediatric Dentistry at (919) [phone]. Children aged 4-14, please call (919) [phone] to request a pediatric application. ° Dental services are provided in all areas of dental care including fillings, crowns and bridges, complete and partial dentures, implants, gum treatment, root canals, and extractions. Preventive care is also provided. Treatment is provided to both adults and children. °Patients are selected via a lottery and there is often a waiting list. °  °[facility] [address], °[place] ° (336) 763-8833 [email or web address] °  °[facility] [address], [place], [place] (336)723-1848, Ext. 123 Second and Fourth Thursday of each month, opens at 6:30 AM; Clinic ends at 9 AM.  Patients are seen on a first-come first-served basis, and a limited number are seen during each clinic.  ° °[facility] ° [address], [place], [place] (336) 723-7904    Eligibility Requirements °You must have lived in [place], [place], or [place] counties for at least the last three months. °  You cannot be eligible for state or federal sponsored healthcare insurance, including Veterans Administration, Medicaid, or Medicare. °  You generally cannot be eligible for healthcare insurance through your employer.  °  How to apply: °Eligibility screenings are held every Tuesday and Wednesday afternoon from 1:00 pm until 4:00 pm. You do not need an appointment for the interview!  °[facility] [address], [place], [place] 336-631-2330   °[facility]  336-342-8273   °[facility]  336-703-3100   °[facility]  336-570-6415   ° °Behavioral Health Resources in the Community: °Intensive Outpatient Programs °Organization         Address  Phone  Notes  °[facility] [address], [place], [place] 336-878-6098   °[facility] [address], [place], [place] 336-832-9800   °[facility] [address], [place], [place] ° 336-882-2125   °[facility] [address],  °[place], [place] 1-800-853-5163 or 336-641-4981   °Substance Abuse Resources °Organization         Address  Phone  Notes  °[facility]  336-882-2125   °[facility]  336-784-9470   °[facility]  336-285-9073   °[facility]  336-845-3988   °Residential & Outpatient Substance Abuse Program  1-800-659-3381   °Psychological Services °Organization         Address  Phone  Notes  °[facility]  336- 832-9600   °[facility]  336- 378-7881   °[facility] [address], [place] 1-800-853-5163 or 336-641-4981   ° °Mobile Crisis Teams °Organization         Address  Phone  Notes  °[facility], Mobile Crisis Care Unit  1-877-626-1772   °Assertive °[facility] ° [address]  [place], [place] 336-834-9664   °Sharon DeEsch [address] °[place] 336-554-5454   ° °Self-Help/Support Groups °Organization         Address  Phone             Notes  °[facility] of  - variety of support groups  336- 373-1402 Call for more information  °Narcotics Anonymous (NA), [facility] [address], °[place]  2 meetings at this location  ° °  Residential Treatment Programs Organization         Address  Phone  Notes  [facility] [address],    [place]  [phone]   [facility]  [address], [place] [phone], [place], [place] [phone]   [facility] [address], [place] [phone] Admissions: 8am-3pm M-F  [facility] [address]    [place], [place] [phone]   [facility] [address], [place], [place] [phone]   [facility] [address]  [place], [place] [phone]   [facility] Intensive Outpatient [address], [place], [place] [phone]   [facility] ([facility] [address]  [place], [place] [phone] or [phone]   [facility] [address] [place], [place] [phone] Accepts Medicaid  [facility] [address]  [place] [phone] Substance Abuse/Addiction Treatment   [facility] Resources Organization         Address  Phone  Notes  [facility]  (782)[phone](888) [phone]   Angie FavaJulie Brannon, PhD [address], [address] [place], [place]   [phone](336) [phone] or ([phone](336) [phone]   [facility]   [address] [place], [place] [phone](336) [phone]   [facility] [address], [place], [place] [phone](336) ([phone] Insurance/Medicaid/sponsorship through [facility] [address]                                    [place], [place] [phone](336) [phone] Therapy/tele-psych/case    [facility] [address]   [place], [place] [phone](336) [phone]    Dr. Lolly MustacheArfeen  [phone]) [phone]   [facility] [place]  [employer] [facility] 1) [address], [place] 2) [address], [place] 3)  [address], [place] [phone](336) [phone](336) [phone]  ([phone](336) [phone]   [facility] Child Abuse Hotline 720-795-8858(336) [phone] or [phone](336) [phone] (After Hours)     Take the prescription as directed. Take over the counter tylenol and ibuprofen, as directed on packaging, as needed for discomfort. Wash the areas with soap and water at least twice a day, and cover with a clean/dry dressing (if needed).  Change the dressing whenever it becomes wet or soiled after washing the area with soap and water.  Apply moist heat to the area(s) of discomfort, for 15 minutes at a time, several times per day for the next few days.  Do not fall asleep on a heating pack.  Call your regular medical doctor Tuesday to schedule a follow up appointment for a recheck within this week.  Return to the Emergency Department immediately if worsening.

## 2015-02-09 ENCOUNTER — Other Ambulatory Visit: Payer: Self-pay | Admitting: Obstetrics and Gynecology

## 2015-02-09 DIAGNOSIS — O3680X0 Pregnancy with inconclusive fetal viability, not applicable or unspecified: Secondary | ICD-10-CM

## 2015-02-10 ENCOUNTER — Ambulatory Visit (INDEPENDENT_AMBULATORY_CARE_PROVIDER_SITE_OTHER): Payer: Medicaid Other

## 2015-02-10 DIAGNOSIS — O208 Other hemorrhage in early pregnancy: Secondary | ICD-10-CM

## 2015-02-10 DIAGNOSIS — Z3A01 Less than 8 weeks gestation of pregnancy: Secondary | ICD-10-CM

## 2015-02-10 DIAGNOSIS — O3680X0 Pregnancy with inconclusive fetal viability, not applicable or unspecified: Secondary | ICD-10-CM | POA: Diagnosis not present

## 2015-02-10 NOTE — Progress Notes (Signed)
Dating US today.  CRL measures 14.6 mm which correlates with 7+[redacted] weeks GA.  EDD 09-23-15. FHR 152 bpm. Bilateral ovaries appear normal. Small subchorionic hemorrhage measuring 1.5 x 1.9 x 1.0 cm.

## 2015-02-23 ENCOUNTER — Encounter: Payer: Self-pay | Admitting: *Deleted

## 2015-02-23 ENCOUNTER — Encounter: Payer: Self-pay | Admitting: Women's Health

## 2015-03-07 NOTE — L&D Delivery Note (Signed)
Delivery Note At 1:42 AM a viable female was delivered via Vaginal, Spontaneous Delivery (Presentation: direct OA).  APGAR: 8, 9; weight pending. Placenta status: intact, spontaneous.  Cord: 3 vessels with the following complications: none.   Anesthesia:  epidural Episiotomy: None Lacerations: None Suture Repair: none Est. Blood Loss (mL):   Mom to postpartum.  Baby to Couplet care / Skin to Skin.  Loni Muse 09/30/2015, 2:47 AM  Patient is a [id number] at [redacted]w[redacted]d who was admitted in active labor, uncomplicated prenatal course.  She progressed with augmentation via AROM.  I was gloved and present for delivery in its entirety.  Second stage of labor progressed, baby delivered after one contraction.  No decels during second stage noted.  Complications: none  Lacerations: none  EBL: 300cc  Ariadne Rissmiller, CNM 7:50 AM  09/30/2015

## 2015-03-10 ENCOUNTER — Encounter: Payer: Self-pay | Admitting: Women's Health

## 2015-03-10 ENCOUNTER — Encounter: Payer: Self-pay | Admitting: *Deleted

## 2015-03-17 ENCOUNTER — Encounter: Payer: Self-pay | Admitting: Women's Health

## 2015-03-17 ENCOUNTER — Encounter: Payer: Self-pay | Admitting: *Deleted

## 2015-03-29 ENCOUNTER — Encounter: Payer: Self-pay | Admitting: Women's Health

## 2015-03-29 ENCOUNTER — Ambulatory Visit (INDEPENDENT_AMBULATORY_CARE_PROVIDER_SITE_OTHER): Payer: Medicaid Other | Admitting: Women's Health

## 2015-03-29 VITALS — BP 90/58 | HR 92 | Wt 110.5 lb

## 2015-03-29 DIAGNOSIS — Z141 Cystic fibrosis carrier: Secondary | ICD-10-CM | POA: Insufficient documentation

## 2015-03-29 DIAGNOSIS — Z3492 Encounter for supervision of normal pregnancy, unspecified, second trimester: Secondary | ICD-10-CM

## 2015-03-29 DIAGNOSIS — H6693 Otitis media, unspecified, bilateral: Secondary | ICD-10-CM

## 2015-03-29 DIAGNOSIS — Z331 Pregnant state, incidental: Secondary | ICD-10-CM

## 2015-03-29 DIAGNOSIS — B9689 Other specified bacterial agents as the cause of diseases classified elsewhere: Secondary | ICD-10-CM

## 2015-03-29 DIAGNOSIS — O26892 Other specified pregnancy related conditions, second trimester: Secondary | ICD-10-CM | POA: Diagnosis not present

## 2015-03-29 DIAGNOSIS — Z1389 Encounter for screening for other disorder: Secondary | ICD-10-CM

## 2015-03-29 DIAGNOSIS — L0292 Furuncle, unspecified: Secondary | ICD-10-CM

## 2015-03-29 DIAGNOSIS — R51 Headache: Secondary | ICD-10-CM

## 2015-03-29 DIAGNOSIS — Z3490 Encounter for supervision of normal pregnancy, unspecified, unspecified trimester: Secondary | ICD-10-CM | POA: Insufficient documentation

## 2015-03-29 DIAGNOSIS — Z720 Tobacco use: Secondary | ICD-10-CM

## 2015-03-29 DIAGNOSIS — F17200 Nicotine dependence, unspecified, uncomplicated: Secondary | ICD-10-CM | POA: Insufficient documentation

## 2015-03-29 DIAGNOSIS — Z0283 Encounter for blood-alcohol and blood-drug test: Secondary | ICD-10-CM

## 2015-03-29 DIAGNOSIS — Z369 Encounter for antenatal screening, unspecified: Secondary | ICD-10-CM

## 2015-03-29 LAB — POCT URINALYSIS DIPSTICK
Blood, UA: NEGATIVE
Glucose, UA: NEGATIVE
Ketones, UA: NEGATIVE
Leukocytes, UA: NEGATIVE
NITRITE UA: NEGATIVE
PROTEIN UA: NEGATIVE

## 2015-03-29 MED ORDER — CEPHALEXIN 500 MG PO CAPS: 500.0000 mg | ORAL_CAPSULE | Freq: Four times a day (QID) | ORAL | Status: DC

## 2015-03-29 NOTE — Progress Notes (Addendum)
Subjective:  Jeanne Hernandez is a 25 y.o. [phone] Caucasian female at [redacted]w[redacted]d by 7wk u/s, being seen today for her first obstetrical visit.  Her obstetrical history is significant for smoker: 1ppd prior to pregnancy now 5cigs/day and trying to quit, term uncomplicated svb x 4, sab x 1.  Pregnancy history fully reviewed.  Patient reports daily headaches- apap wasn't helping- was taking ibuprofen which was helping- didn't know she shouldn't take. Earraches for about 1 month- has noticed knot behind Rt ear, boils on Rt breast for about 1 month, will come and go. Denies vb, cramping, uti s/s, abnormal/malodorous vag d/c, or vulvovaginal itching/irritation.  BP 90/58 mmHg  Pulse 92  Wt 110 lb 8 oz (50.122 kg)  LMP 12/24/2014 (Approximate)  HISTORY: OB History  Gravida Para Term Preterm AB SAB TAB Ectopic Multiple Living  # Outcome Date GA Lbr Len/2nd Weight Sex Delivery Anes PTL Lv  6 Current           5 Term 01/18/14 [redacted]w[redacted]d  6 lb 3 oz (2.807 kg) F Vag-Spont EPI N Y  4 Term 02/15/13 [redacted]w[redacted]d  6 lb 13.1 oz (3.093 kg) F Vag-Spont EPI N Y     Comments: System Generated. Please review and update pregnancy details.  3 Term 11/08/10 [redacted]w[redacted]d -18:36 / 00:07 6 lb 3.8 oz (2.828 kg) M Vag-Spont EPI N Y     Comments: none  2 Term 05/20/08 [redacted]w[redacted]d 16:00 6 lb 12 oz (3.062 kg) M Vag-Spont EPI N Y  1 SAB              Past Medical History  Diagnosis Date  . Cystic fibrosis gene carrier   . Depression   . Anxiety   . Back pain in pregnancy 10/09/2012  . Anemia    Past Surgical History  Procedure Laterality Date  . Appendectomy     History reviewed. No pertinent family history.  Exam   System:     General: Well developed & nourished, no acute distress   Ears: Fluid on Rt ear w/ small postauricular lymph node, Cerumen and small amt fluid Lt ear   Skin: Warm & dry, normal coloration and turgor, no rashes 2 boils Rt breast: one healing, one inner aspect ~1.5cm red   Neurologic: Alert &  oriented, normal mood   Cardiovascular: Regular rate & rhythm   Respiratory: Effort & rate normal, LCTAB, acyanotic   Abdomen: Soft, non tender   Extremities: normal strength, tone   Thin prep pap smear neg 07/2012- will need after 07/2015  FHR: 160 via doppler   Assessment:   Pregnancy: [id number] Patient Active Problem List   Diagnosis Date Noted  . Anemia in pregnancy 12/20/2012    Priority: High  . Supervision of other normal pregnancy 08/01/2012    Priority: High  . Vaginitis and vulvovaginitis, unspecified 11/21/2012  . Back pain in pregnancy 10/09/2012  . Bipolar disorder current episode depressed (HCC) 05/04/2011    Class: Acute    [redacted]w[redacted]d [id number] New OB visit Headaches Bilateral ear infection Boils Rt breast Small SCH on dating u/s- no bleeding Smoker  Plan:  Initial labs drawn Continue prenatal vitamins Problem list reviewed and updated Reviewed n/v relief measures and warning s/s to report Reviewed recommended weight gain based on pre-gravid BMI Encouraged well-balanced diet Genetic Screening discussed Quad Screen: requested Cystic fibrosis screening discussed known carrier- FOB unknown Ultrasound discussed; fetal survey: requested  Rx keflex qid  x 7d for bilateral ear infection and breast boils, can also use warm compresses to boils Follow up in 2d for boil f/u, then 3wks for visit and AFP CCNC completed Needs pap after 08/02/15 Declines flu shot Advised complete smoking cessation Gave printed info for headache prevention/relief, no more ibuprofen  Marge Duncans CNM, [facility] 03/29/2015 2:35 PM

## 2015-03-29 NOTE — Patient Instructions (Addendum)
For Headaches:   Stay well hydrated, drink enough water so that your urine is clear, sometimes if you are dehydrated you can get headaches  Eat small frequent meals and snacks, sometimes if you are hungry you can get headaches  Sometimes you get headaches during pregnancy from the pregnancy hormones  You can try tylenol (1-2 regular strength  or 1-2 extra strength ) as directed on the box. The least amount of medication that works is best.   Cool compresses (Immunologist or ice pack) to area of head that is hurting  You can also try drinking a caffeinated drink to see if this will help  If these things aren't helping- try below  For Prevention of Headaches/Migraines:  CoQ10  three times daily  Vitamin B2  daily  Magnesium Oxide 400-600mg  daily  If You Get a Bad Headache/Migraine:  Benadryl    Magnesium Oxide  1 large Gatorade  2 extra strength Tylenol (1,000mg  total)  1 cup coffee or Coke  If this doesn't help please call us @ [phone]   Second Trimester of Pregnancy The second trimester is from week 13 through week 28, months 4 through 6. The second trimester is often a time when you feel your best. Your body has also adjusted to being pregnant, and you begin to feel better physically. Usually, morning sickness has lessened or quit completely, you may have more energy, and you may have an increase in appetite. The second trimester is also a time when the fetus is growing rapidly. At the end of the sixth month, the fetus is about 9 inches long and weighs about 1 pounds. You will likely begin to feel the baby move (quickening) between 18 and 20 weeks of the pregnancy. BODY CHANGES Your body goes through many changes during pregnancy. The changes vary from woman to woman.   Your weight will continue to increase. You will notice your lower abdomen bulging out.  You may begin to get stretch marks on your hips, abdomen, and breasts.  You  may develop headaches that can be relieved by medicines approved by your health care provider.  You may urinate more often because the fetus is pressing on your bladder.  You may develop or continue to have heartburn as a result of your pregnancy.  You may develop constipation because certain hormones are causing the muscles that push waste through your intestines to slow down.  You may develop hemorrhoids or swollen, bulging veins (varicose veins).  You may have back pain because of the weight gain and pregnancy hormones relaxing your joints between the bones in your pelvis and as a result of a shift in weight and the muscles that support your balance.  Your breasts will continue to grow and be tender.  Your gums may bleed and may be sensitive to brushing and flossing.  Dark spots or blotches (chloasma, mask of pregnancy) may develop on your face. This will likely fade after the baby is born.  A dark line from your belly button to the pubic area (linea nigra) may appear. This will likely fade after the baby is born.  You may have changes in your hair. These can include thickening of your hair, rapid growth, and changes in texture. Some women also have hair loss during or after pregnancy, or hair that feels dry or thin. Your hair will most likely return to normal after your baby is born. WHAT TO EXPECT AT YOUR PRENATAL VISITS During a routine prenatal visit:  You will be weighed to make sure you and the fetus are growing normally.  Your blood pressure will be taken.  Your abdomen will be measured to track your baby's growth.  The fetal heartbeat will be listened to.  Any test results from the previous visit will be discussed. Your health care provider may ask you:  How you are feeling.  If you are feeling the baby move.  If you have had any abnormal symptoms, such as leaking fluid, bleeding, severe headaches, or abdominal cramping.  If you are using any tobacco products,  including cigarettes, chewing tobacco, and electronic cigarettes.  If you have any questions. Other tests that may be performed during your second trimester include:  Blood tests that check for:  Low iron levels (anemia).  Gestational diabetes (between 24 and 28 weeks).  Rh antibodies.  Urine tests to check for infections, diabetes, or protein in the urine.  An ultrasound to confirm the proper growth and development of the baby.  An amniocentesis to check for possible genetic problems.  Fetal screens for spina bifida and Down syndrome.  HIV (human immunodeficiency virus) testing. Routine prenatal testing includes screening for HIV, unless you choose not to have this test. HOME CARE INSTRUCTIONS   Avoid all smoking, herbs, alcohol, and unprescribed drugs. These chemicals affect the formation and growth of the baby.  Do not use any tobacco products, including cigarettes, chewing tobacco, and electronic cigarettes. If you need help quitting, ask your health care provider. You may receive counseling support and other resources to help you quit.  Follow your health care provider's instructions regarding medicine use. There are medicines that are either safe or unsafe to take during pregnancy.  Exercise only as directed by your health care provider. Experiencing uterine cramps is a good sign to stop exercising.  Continue to eat regular, healthy meals.  Wear a good support bra for breast tenderness.  Do not use hot tubs, steam rooms, or saunas.  Wear your seat belt at all times when driving.  Avoid raw meat, uncooked cheese, cat litter boxes, and soil used by cats. These carry germs that can cause birth defects in the baby.  Take your prenatal vitamins.  Take 1500-2000 mg of calcium daily starting at the 20th week of pregnancy until you deliver your baby.  Try taking a stool softener (if your health care provider approves) if you develop constipation. Eat more high-fiber foods,  such as fresh vegetables or fruit and whole grains. Drink plenty of fluids to keep your urine clear or pale yellow.  Take warm sitz baths to soothe any pain or discomfort caused by hemorrhoids. Use hemorrhoid cream if your health care provider approves.  If you develop varicose veins, wear support hose. Elevate your feet for 15 minutes, 3-4 times a day. Limit salt in your diet.  Avoid heavy lifting, wear low heel shoes, and practice good posture.  Rest with your legs elevated if you have leg cramps or low back pain.  Visit your dentist if you have not gone yet during your pregnancy. Use a soft toothbrush to brush your teeth and be gentle when you floss.  A sexual relationship may be continued unless your health care provider directs you otherwise.  Continue to go to all your prenatal visits as directed by your health care provider. SEEK MEDICAL CARE IF:   You have dizziness.  You have mild pelvic cramps, pelvic pressure, or nagging pain in the abdominal area.  You have persistent nausea, vomiting,  or diarrhea.  You have a bad smelling vaginal discharge.  You have pain with urination. SEEK IMMEDIATE MEDICAL CARE IF:   You have a fever.  You are leaking fluid from your vagina.  You have spotting or bleeding from your vagina.  You have severe abdominal cramping or pain.  You have rapid weight gain or loss.  You have shortness of breath with chest pain.  You notice sudden or extreme swelling of your face, hands, ankles, feet, or legs.  You have not felt your baby move in over an hour.  You have severe headaches that do not go away with medicine.  You have vision changes.   This information is not intended to replace advice given to you by your health care provider. Make sure you discuss any questions you have with your health care provider.   Document Released: 02/14/2001 Document Revised: 03/13/2014 Document Reviewed: 04/23/2012 Elsevier Interactive Patient Education  [employer]

## 2015-03-30 ENCOUNTER — Encounter: Payer: Self-pay | Admitting: Women's Health

## 2015-03-30 DIAGNOSIS — F1290 Cannabis use, unspecified, uncomplicated: Secondary | ICD-10-CM | POA: Insufficient documentation

## 2015-03-30 LAB — PMP SCREEN PROFILE (10S), URINE
AMPHETAMINE SCRN UR: NEGATIVE ng/mL
Barbiturate Screen, Ur: NEGATIVE ng/mL
Benzodiazepine Screen, Urine: NEGATIVE ng/mL
CANNABINOIDS UR QL SCN: POSITIVE ng/mL
COCAINE(METAB.) SCREEN, URINE: NEGATIVE ng/mL
Creatinine(Crt), U: 93.2 mg/dL (ref 20.0–300.0)
Methadone Scn, Ur: NEGATIVE ng/mL
OXYCODONE+OXYMORPHONE UR QL SCN: POSITIVE ng/mL
Opiate Scrn, Ur: NEGATIVE ng/mL
PCP SCRN UR: NEGATIVE ng/mL
Ph of Urine: 8.5 (ref 4.5–8.9)
Propoxyphene, Screen: NEGATIVE ng/mL

## 2015-03-30 LAB — CBC
HEMATOCRIT: 38.2 % (ref 34.0–46.6)
Hemoglobin: 13 g/dL (ref 11.1–15.9)
MCH: 32.5 pg (ref 26.6–33.0)
MCHC: 34 g/dL (ref 31.5–35.7)
MCV: 96 fL (ref 79–97)
Platelets: 311 10*3/uL (ref 150–379)
RBC: 4 x10E6/uL (ref 3.77–5.28)
RDW: 13.6 % (ref 12.3–15.4)
WBC: 11.8 10*3/uL — AB (ref 3.4–10.8)

## 2015-03-30 LAB — URINALYSIS, ROUTINE W REFLEX MICROSCOPIC
Bilirubin, UA: NEGATIVE
Glucose, UA: NEGATIVE
Ketones, UA: NEGATIVE
LEUKOCYTES UA: NEGATIVE
Nitrite, UA: NEGATIVE
PH UA: 8.5 — AB (ref 5.0–7.5)
RBC, UA: NEGATIVE
Specific Gravity, UA: 1.019 (ref 1.005–1.030)
Urobilinogen, Ur: 0.2 mg/dL (ref 0.2–1.0)

## 2015-03-30 LAB — HIV ANTIBODY (ROUTINE TESTING W REFLEX): HIV Screen 4th Generation wRfx: NONREACTIVE

## 2015-03-30 LAB — RUBELLA SCREEN: Rubella Antibodies, IGG: 4.9 index (ref >0.99)

## 2015-03-30 LAB — VARICELLA ZOSTER ANTIBODY, IGG: Varicella zoster IgG: 825 index (ref >165)

## 2015-03-30 LAB — ABO/RH: RH TYPE: POSITIVE

## 2015-03-30 LAB — RPR: RPR: NONREACTIVE

## 2015-03-30 LAB — ANTIBODY SCREEN: Antibody Screen: NEGATIVE

## 2015-03-30 LAB — HEPATITIS B SURFACE ANTIGEN: Hepatitis B Surface Ag: NEGATIVE

## 2015-03-31 ENCOUNTER — Encounter: Payer: Self-pay | Admitting: Women's Health

## 2015-03-31 ENCOUNTER — Ambulatory Visit (INDEPENDENT_AMBULATORY_CARE_PROVIDER_SITE_OTHER): Payer: Self-pay | Admitting: Women's Health

## 2015-03-31 VITALS — BP 92/52 | HR 76 | Wt 111.0 lb

## 2015-03-31 DIAGNOSIS — F1210 Cannabis abuse, uncomplicated: Secondary | ICD-10-CM

## 2015-03-31 DIAGNOSIS — Z331 Pregnant state, incidental: Secondary | ICD-10-CM

## 2015-03-31 DIAGNOSIS — Z1389 Encounter for screening for other disorder: Secondary | ICD-10-CM

## 2015-03-31 DIAGNOSIS — F1290 Cannabis use, unspecified, uncomplicated: Secondary | ICD-10-CM

## 2015-03-31 DIAGNOSIS — L0292 Furuncle, unspecified: Secondary | ICD-10-CM

## 2015-03-31 LAB — POCT URINALYSIS DIPSTICK
Blood, UA: NEGATIVE
Glucose, UA: NEGATIVE
Ketones, UA: NEGATIVE
LEUKOCYTES UA: NEGATIVE
Nitrite, UA: NEGATIVE
Protein, UA: NEGATIVE

## 2015-03-31 LAB — GC/CHLAMYDIA PROBE AMP
Chlamydia trachomatis, NAA: NEGATIVE
Neisseria gonorrhoeae by PCR: NEGATIVE

## 2015-03-31 LAB — URINE CULTURE

## 2015-03-31 NOTE — Patient Instructions (Signed)
Finish all of the antibiotics, don't mess with the boils, you can use warm compresses if you would like If getting worse call us or go to emergency room

## 2015-03-31 NOTE — Progress Notes (Signed)
   [facility] Visit  Patient name: Jeanne Hernandez MRN [id number]  Date of birth: February 25, 1991  CC & HPI:  Jeanne Hernandez is a 25 y.o. [id number] Caucasian female at [redacted]w[redacted]d presenting today for f/u Rt breast boils. Was placed on keflex qid 2 days ago for boils as well as ear infections. States she has been taking as directed. Yesterday boil came to head and boyfriend expressed 'a lot' of pus from the boil, he also bruised her breast. Other smaller boil on Rt breast that was going away is still continuing to get smaller.  Also discussed +THC on UDS- states last used ~15mth ago to help w/ sleep, hasn't used since.  Patient's last menstrual period was 12/24/2014 (approximate).  Pertinent History Reviewed:  Medical & Surgical Hx:   Past Medical History  Diagnosis Date  . Cystic fibrosis gene carrier   . Depression   . Anxiety   . Back pain in pregnancy 10/09/2012  . Anemia    Past Surgical History  Procedure Laterality Date  . Appendectomy     Medications: Reviewed & Updated - see associated section Social History: Reviewed -  reports that she has been smoking Cigarettes.  She has a 2.5 pack-year smoking history. She has never used smokeless tobacco.  Objective Findings:  Vitals: BP 92/52 mmHg  Pulse 76  Wt 111 lb (50.349 kg)  LMP 12/24/2014 (Approximate) Body mass index is 19.97 kg/(m^2).  Physical Examination: General appearance - alert, well appearing, and in no distress Breasts - Rt breast- smaller boil looking good, larger boil on inner aspect of breast now more flattened, still red, not fluctuant, has scab in center w/ yellowish bruise around tissue. Do not feel it needs to be I&D'd at this time, JAG in for co-exam who agrees  Results for orders placed or performed in visit on 03/31/15 (from the past 24 hour(s))  POCT urinalysis dipstick   Collection Time: 03/31/15  4:17 PM  Result Value Ref Range   Color, UA     Clarity, UA     Glucose, UA neg    Bilirubin, UA     Ketones, UA neg    Spec Grav, UA     Blood, UA neg    pH, UA     Protein, UA neg    Urobilinogen, UA     Nitrite, UA neg    Leukocytes, UA Negative Negative     Assessment & Plan:  A:   F/U on Rt breast boils  [redacted]w[redacted]d pregnant  THC on UDS 2d ago  P:  Boils improving, continue keflex until completed, let us know if boils worsen or do not continue to improve  Can still use warm compresses if would like  Do not touch/pick at boils  Advised complete cessation of THC  Return for As scheduled.  Marge Duncans CNM, [facility] 03/31/2015 4:54 PM

## 2015-04-20 ENCOUNTER — Encounter: Payer: Self-pay | Admitting: *Deleted

## 2015-04-20 ENCOUNTER — Encounter: Payer: Self-pay | Admitting: Advanced Practice Midwife

## 2015-04-27 ENCOUNTER — Encounter: Payer: Self-pay | Admitting: Obstetrics and Gynecology

## 2015-04-30 ENCOUNTER — Encounter: Payer: Self-pay | Admitting: Obstetrics and Gynecology

## 2015-05-06 ENCOUNTER — Ambulatory Visit (INDEPENDENT_AMBULATORY_CARE_PROVIDER_SITE_OTHER): Payer: Self-pay | Admitting: Advanced Practice Midwife

## 2015-05-06 VITALS — BP 100/60 | HR 90 | Wt 116.0 lb

## 2015-05-06 DIAGNOSIS — Z369 Encounter for antenatal screening, unspecified: Secondary | ICD-10-CM

## 2015-05-06 DIAGNOSIS — Z3492 Encounter for supervision of normal pregnancy, unspecified, second trimester: Secondary | ICD-10-CM

## 2015-05-06 DIAGNOSIS — Z1389 Encounter for screening for other disorder: Secondary | ICD-10-CM

## 2015-05-06 DIAGNOSIS — Z363 Encounter for antenatal screening for malformations: Secondary | ICD-10-CM

## 2015-05-06 DIAGNOSIS — Z331 Pregnant state, incidental: Secondary | ICD-10-CM

## 2015-05-06 LAB — POCT URINALYSIS DIPSTICK
Blood, UA: 1
GLUCOSE UA: NEGATIVE
KETONES UA: NEGATIVE
LEUKOCYTES UA: NEGATIVE
NITRITE UA: NEGATIVE

## 2015-05-06 MED ORDER — OSELTAMIVIR PHOSPHATE 75 MG PO CAPS: 75.0000 mg | ORAL_CAPSULE | Freq: Every day | ORAL | Status: DC

## 2015-05-06 NOTE — Patient Instructions (Signed)

## 2015-05-06 NOTE — Progress Notes (Signed)
[id number] [redacted]w[redacted]d Estimated Date of Delivery: 09/23/15  Blood pressure 100/60, pulse 90, weight 116 lb (52.617 kg), last menstrual period 12/24/2014, unknown if currently breastfeeding.   BP weight and urine results all reviewed and noted.  Please refer to the obstetrical flow sheet for the fundal height and fetal heart rate documentation:  Patient reports good fetal movement, denies any bleeding and no rupture of membranes symptoms or regular contractions. Patient is without complaints.  4 family members that llive in her house have tested + for influenza.  All questions were answered.  Orders Placed This Encounter  Procedures  . US OB Comp + 14 Wk  . POCT urinalysis dipstick    Plan:  Continued routine obstetrical care, AFP today  Will start Tamiflu (profilactic dose is  daily for 10 day).   Return for ASAP for AV:[id number] and 4 weeks for LROB.

## 2015-05-06 NOTE — Progress Notes (Signed)
Pt denies any problems or concerns at this time.  

## 2015-05-10 ENCOUNTER — Ambulatory Visit (INDEPENDENT_AMBULATORY_CARE_PROVIDER_SITE_OTHER): Payer: Medicaid Other

## 2015-05-10 DIAGNOSIS — Z363 Encounter for antenatal screening for malformations: Secondary | ICD-10-CM

## 2015-05-10 DIAGNOSIS — Z36 Encounter for antenatal screening of mother: Secondary | ICD-10-CM | POA: Diagnosis not present

## 2015-05-10 DIAGNOSIS — Z3A21 21 weeks gestation of pregnancy: Secondary | ICD-10-CM

## 2015-05-10 DIAGNOSIS — Z3492 Encounter for supervision of normal pregnancy, unspecified, second trimester: Secondary | ICD-10-CM

## 2015-05-10 NOTE — Progress Notes (Signed)
US 20+4wks,measurements c/w dates,normal ov's bilat,svp of fluid 6.6cm,efw 348 g,fhr 164 bpm,anatomy complete no obvious abnormalities seen

## 2015-05-11 LAB — AFP, QUAD SCREEN
DIA Mom Value: 3.13
DIA Value (EIA): 749.46 pg/mL
DSR (By Age)    1 IN: 1052
DSR (SECOND TRIMESTER) 1 IN: 485
GESTATIONAL AGE AFP: 20 wk
MATERNAL AGE AT EDD: 24.5 a
MSAFP Mom: 1.06
MSAFP: 69.9 ng/mL
MSHCG Mom: 0.98
MSHCG: 27554 m[IU]/mL
OSB RISK: 10000
PDF: 0
T18 (By Age): 1:4098 {titer}
Test Results:: NEGATIVE
UE3 MOM: 0.91
Weight: 116 [lb_av]
uE3 Value: 1.94 ng/mL

## 2015-06-03 ENCOUNTER — Encounter: Payer: Self-pay | Admitting: Obstetrics & Gynecology

## 2015-06-09 ENCOUNTER — Encounter: Payer: Self-pay | Admitting: Women's Health

## 2015-06-09 ENCOUNTER — Ambulatory Visit (INDEPENDENT_AMBULATORY_CARE_PROVIDER_SITE_OTHER): Payer: Medicaid Other | Admitting: Women's Health

## 2015-06-09 ENCOUNTER — Other Ambulatory Visit: Payer: Self-pay | Admitting: Women's Health

## 2015-06-09 VITALS — BP 120/60 | HR 96 | Wt 121.3 lb

## 2015-06-09 DIAGNOSIS — Z331 Pregnant state, incidental: Secondary | ICD-10-CM

## 2015-06-09 DIAGNOSIS — R350 Frequency of micturition: Secondary | ICD-10-CM

## 2015-06-09 DIAGNOSIS — Z3492 Encounter for supervision of normal pregnancy, unspecified, second trimester: Secondary | ICD-10-CM

## 2015-06-09 NOTE — Progress Notes (Signed)
Low-risk OB appointment [id number] Estimated Date of Delivery: 09/23/15 BP 120/60 mmHg  Pulse 96  Wt 121 lb 4.8 oz (55.021 kg)  LMP 12/24/2014 (Approximate)  BP, weight, and urine reviewed.  Refer to obstetrical flow sheet for FH & FHR.  Reports good fm.  Denies regular uc's, lof, vb, or uti s/s. Sleeps too much, up to 15hrs/night, doesn't get up until 2-3pm. Denies depression. To go to bed as soon as she puts children to bed, set alarm to get up 8-9hrs later- put alarm on opposite side of room.  Reviewed ptl s/s, fm. Plan:  Continue routine obstetrical care  F/U in 4wks for OB appointment and pn2

## 2015-06-09 NOTE — Patient Instructions (Signed)
You will have your sugar test next visit.  Please do not eat or drink anything after midnight the night before you come, not even water.  You will be here for at least two hours.     Call the office ([phone]) or go to [facility] if:  You begin to have strong, frequent contractions  Your water breaks.  Sometimes it is a big gush of fluid, sometimes it is just a trickle that keeps getting your panties wet or running down your legs  You have vaginal bleeding.  It is normal to have a small amount of spotting if your cervix was checked.   You don't feel your baby moving like normal.  If you don't, get you something to eat and drink and lay down and focus on feeling your baby move.   If your baby is still not moving like normal, you should call the office or go to [facility].  Second Trimester of Pregnancy The second trimester is from week 13 through week 28, months 4 through 6. The second trimester is often a time when you feel your best. Your body has also adjusted to being pregnant, and you begin to feel better physically. Usually, morning sickness has lessened or quit completely, you may have more energy, and you may have an increase in appetite. The second trimester is also a time when the fetus is growing rapidly. At the end of the sixth month, the fetus is about 9 inches long and weighs about 1 pounds. You will likely begin to feel the baby move (quickening) between 18 and 20 weeks of the pregnancy. BODY CHANGES Your body goes through many changes during pregnancy. The changes vary from woman to woman.   Your weight will continue to increase. You will notice your lower abdomen bulging out.  You may begin to get stretch marks on your hips, abdomen, and breasts.  You may develop headaches that can be relieved by medicines approved by your health care provider.  You may urinate more often because the fetus is pressing on your bladder.  You may develop or continue to have  heartburn as a result of your pregnancy.  You may develop constipation because certain hormones are causing the muscles that push waste through your intestines to slow down.  You may develop hemorrhoids or swollen, bulging veins (varicose veins).  You may have back pain because of the weight gain and pregnancy hormones relaxing your joints between the bones in your pelvis and as a result of a shift in weight and the muscles that support your balance.  Your breasts will continue to grow and be tender.  Your gums may bleed and may be sensitive to brushing and flossing.  Dark spots or blotches (chloasma, mask of pregnancy) may develop on your face. This will likely fade after the baby is born.  A dark line from your belly button to the pubic area (linea nigra) may appear. This will likely fade after the baby is born.  You may have changes in your hair. These can include thickening of your hair, rapid growth, and changes in texture. Some women also have hair loss during or after pregnancy, or hair that feels dry or thin. Your hair will most likely return to normal after your baby is born. WHAT TO EXPECT AT YOUR PRENATAL VISITS During a routine prenatal visit:  You will be weighed to make sure you and the fetus are growing normally.  Your blood pressure will be taken.    Your abdomen will be measured to track your baby's growth.  The fetal heartbeat will be listened to.  Any test results from the previous visit will be discussed. Your health care provider may ask you:  How you are feeling.  If you are feeling the baby move.  If you have had any abnormal symptoms, such as leaking fluid, bleeding, severe headaches, or abdominal cramping.  If you have any questions. Other tests that may be performed during your second trimester include:  Blood tests that check for:  Low iron levels (anemia).  Gestational diabetes (between 24 and 28 weeks).  Rh antibodies.  Urine tests to check  for infections, diabetes, or protein in the urine.  An ultrasound to confirm the proper growth and development of the baby.  An amniocentesis to check for possible genetic problems.  Fetal screens for spina bifida and Down syndrome. HOME CARE INSTRUCTIONS   Avoid all smoking, herbs, alcohol, and unprescribed drugs. These chemicals affect the formation and growth of the baby.  Follow your health care provider's instructions regarding medicine use. There are medicines that are either safe or unsafe to take during pregnancy.  Exercise only as directed by your health care provider. Experiencing uterine cramps is a good sign to stop exercising.  Continue to eat regular, healthy meals.  Wear a good support bra for breast tenderness.  Do not use hot tubs, steam rooms, or saunas.  Wear your seat belt at all times when driving.  Avoid raw meat, uncooked cheese, cat litter boxes, and soil used by cats. These carry germs that can cause birth defects in the baby.  Take your prenatal vitamins.  Try taking a stool softener (if your health care provider approves) if you develop constipation. Eat more high-fiber foods, such as fresh vegetables or fruit and whole grains. Drink plenty of fluids to keep your urine clear or pale yellow.  Take warm sitz baths to soothe any pain or discomfort caused by hemorrhoids. Use hemorrhoid cream if your health care provider approves.  If you develop varicose veins, wear support hose. Elevate your feet for 15 minutes, 3-4 times a day. Limit salt in your diet.  Avoid heavy lifting, wear low heel shoes, and practice good posture.  Rest with your legs elevated if you have leg cramps or low back pain.  Visit your dentist if you have not gone yet during your pregnancy. Use a soft toothbrush to brush your teeth and be gentle when you floss.  A sexual relationship may be continued unless your health care provider directs you otherwise.  Continue to go to all your  prenatal visits as directed by your health care provider. SEEK MEDICAL CARE IF:   You have dizziness.  You have mild pelvic cramps, pelvic pressure, or nagging pain in the abdominal area.  You have persistent nausea, vomiting, or diarrhea.  You have a bad smelling vaginal discharge.  You have pain with urination. SEEK IMMEDIATE MEDICAL CARE IF:   You have a fever.  You are leaking fluid from your vagina.  You have spotting or bleeding from your vagina.  You have severe abdominal cramping or pain.  You have rapid weight gain or loss.  You have shortness of breath with chest pain.  You notice sudden or extreme swelling of your face, hands, ankles, feet, or legs.  You have not felt your baby move in over an hour.  You have severe headaches that do not go away with medicine.  You have vision changes.   Document Released: 02/14/2001 Document Revised: 02/25/2013 Document Reviewed: 04/23/2012 [employer] Patient Information 2015 [employer]. This information is not intended to replace advice given to you by your health care provider. Make sure you discuss any questions you have with your health care provider.     

## 2015-06-11 LAB — URINE CULTURE: Organism ID, Bacteria: NO GROWTH

## 2015-07-09 ENCOUNTER — Encounter: Payer: Medicaid Other | Admitting: Obstetrics & Gynecology

## 2015-07-09 ENCOUNTER — Other Ambulatory Visit: Payer: Medicaid Other

## 2015-07-12 ENCOUNTER — Other Ambulatory Visit: Payer: Medicaid Other

## 2015-07-12 ENCOUNTER — Encounter: Payer: Medicaid Other | Admitting: Obstetrics and Gynecology

## 2015-07-16 ENCOUNTER — Encounter: Payer: Medicaid Other | Admitting: Obstetrics and Gynecology

## 2015-07-16 ENCOUNTER — Other Ambulatory Visit: Payer: Medicaid Other

## 2015-07-23 ENCOUNTER — Ambulatory Visit (INDEPENDENT_AMBULATORY_CARE_PROVIDER_SITE_OTHER): Payer: Medicaid Other | Admitting: Obstetrics & Gynecology

## 2015-07-23 ENCOUNTER — Other Ambulatory Visit: Payer: Medicaid Other

## 2015-07-23 ENCOUNTER — Encounter: Payer: Self-pay | Admitting: Obstetrics & Gynecology

## 2015-07-23 VITALS — BP 110/60 | HR 96 | Wt 126.0 lb

## 2015-07-23 DIAGNOSIS — Z1389 Encounter for screening for other disorder: Secondary | ICD-10-CM

## 2015-07-23 DIAGNOSIS — Z331 Pregnant state, incidental: Secondary | ICD-10-CM

## 2015-07-23 DIAGNOSIS — Z3A32 32 weeks gestation of pregnancy: Secondary | ICD-10-CM

## 2015-07-23 DIAGNOSIS — Z3493 Encounter for supervision of normal pregnancy, unspecified, third trimester: Secondary | ICD-10-CM

## 2015-07-23 DIAGNOSIS — Z3483 Encounter for supervision of other normal pregnancy, third trimester: Secondary | ICD-10-CM

## 2015-07-23 LAB — POCT URINALYSIS DIPSTICK
GLUCOSE UA: NEGATIVE
KETONES UA: NEGATIVE
Leukocytes, UA: NEGATIVE
NITRITE UA: NEGATIVE

## 2015-07-23 MED ORDER — OMEPRAZOLE 20 MG PO CPDR: 20.0000 mg | DELAYED_RELEASE_CAPSULE | Freq: Every day | ORAL | Status: DC

## 2015-07-23 NOTE — Progress Notes (Signed)
[id number] Estimated Date of Delivery: 09/23/15  Blood pressure 110/60, pulse 96, weight 126 lb (57.153 kg), last menstrual period 12/24/2014, unknown if currently breastfeeding.   BP weight and urine results all reviewed and noted.  Please refer to the obstetrical flow sheet for the fundal height and fetal heart rate documentation:  Patient reports good fetal movement, denies any bleeding and no rupture of membranes symptoms or regular contractions. Patient is without complaints. All questions were answered.  Orders Placed This Encounter  Procedures  . POCT urinalysis dipstick    Plan:  Continued routine obstetrical care,   Meds ordered this encounter  Medications  . omeprazole (PRILOSEC) 20 MG capsule    Sig: Take 1 capsule (20 mg total) by mouth daily. 1 tablet a day    Dispense:  30 capsule    Refill:  6     No Follow-up on file.

## 2015-07-24 LAB — HIV ANTIBODY (ROUTINE TESTING W REFLEX): HIV SCREEN 4TH GENERATION: NONREACTIVE

## 2015-07-24 LAB — CBC
Hematocrit: 28.4 % — ABNORMAL LOW (ref 34.0–46.6)
Hemoglobin: 9.6 g/dL — ABNORMAL LOW (ref 11.1–15.9)
MCH: 33.4 pg — ABNORMAL HIGH (ref 26.6–33.0)
MCHC: 33.8 g/dL (ref 31.5–35.7)
MCV: 99 fL — ABNORMAL HIGH (ref 79–97)
PLATELETS: 278 10*3/uL (ref 150–379)
RBC: 2.87 x10E6/uL — ABNORMAL LOW (ref 3.77–5.28)
RDW: 12.8 % (ref 12.3–15.4)
WBC: 12.1 10*3/uL — AB (ref 3.4–10.8)

## 2015-07-24 LAB — GLUCOSE TOLERANCE, 2 HOURS W/ 1HR
GLUCOSE, FASTING: 91 mg/dL (ref 65–91)
Glucose, 1 hour: 149 mg/dL (ref 65–179)
Glucose, 2 hour: 83 mg/dL (ref 65–152)

## 2015-07-24 LAB — RPR: RPR Ser Ql: NONREACTIVE

## 2015-07-24 LAB — ANTIBODY SCREEN: Antibody Screen: NEGATIVE

## 2015-07-26 ENCOUNTER — Other Ambulatory Visit: Payer: Self-pay | Admitting: Women's Health

## 2015-07-26 MED ORDER — FERROUS SULFATE 325 (65 FE) MG PO TABS: 325.0000 mg | ORAL_TABLET | Freq: Two times a day (BID) | ORAL | Status: DC

## 2015-07-27 ENCOUNTER — Telehealth: Payer: Self-pay | Admitting: *Deleted

## 2015-07-27 ENCOUNTER — Telehealth: Payer: Self-pay | Admitting: Women's Health

## 2015-07-27 NOTE — Telephone Encounter (Signed)
Pt informed and voiced understanding

## 2015-07-27 NOTE — Telephone Encounter (Signed)
Pt informed of anemia and need for RX Fe along with PNV and increase of iron rich foods per Joellyn HaffKim Booker, CNM.  Pt verbalized understanding.

## 2015-08-12 ENCOUNTER — Encounter: Payer: Medicaid Other | Admitting: Obstetrics and Gynecology

## 2015-08-12 ENCOUNTER — Encounter: Payer: Self-pay | Admitting: Obstetrics and Gynecology

## 2015-08-19 ENCOUNTER — Encounter: Payer: Medicaid Other | Admitting: Obstetrics and Gynecology

## 2015-08-24 ENCOUNTER — Encounter: Payer: Self-pay | Admitting: Obstetrics & Gynecology

## 2015-08-24 ENCOUNTER — Ambulatory Visit (INDEPENDENT_AMBULATORY_CARE_PROVIDER_SITE_OTHER): Payer: Medicaid Other | Admitting: Obstetrics & Gynecology

## 2015-08-24 VITALS — BP 110/70 | HR 84 | Wt 129.4 lb

## 2015-08-24 DIAGNOSIS — Z3483 Encounter for supervision of other normal pregnancy, third trimester: Secondary | ICD-10-CM

## 2015-08-24 DIAGNOSIS — Z3493 Encounter for supervision of normal pregnancy, unspecified, third trimester: Secondary | ICD-10-CM

## 2015-08-24 DIAGNOSIS — Z1389 Encounter for screening for other disorder: Secondary | ICD-10-CM

## 2015-08-24 DIAGNOSIS — Z3A36 36 weeks gestation of pregnancy: Secondary | ICD-10-CM

## 2015-08-24 DIAGNOSIS — Z331 Pregnant state, incidental: Secondary | ICD-10-CM

## 2015-08-24 LAB — POCT URINALYSIS DIPSTICK
GLUCOSE UA: NEGATIVE
KETONES UA: NEGATIVE
Leukocytes, UA: NEGATIVE
Nitrite, UA: NEGATIVE
Protein, UA: 1
RBC UA: NEGATIVE

## 2015-08-24 NOTE — Progress Notes (Signed)
[id number] Estimated Date of Delivery: 09/23/15  Blood pressure 110/70, pulse 84, weight 129 lb 6.4 oz (58.695 kg), last menstrual period 12/24/2014, unknown if currently breastfeeding.   BP weight and urine results all reviewed and noted.  Please refer to the obstetrical flow sheet for the fundal height and fetal heart rate documentation:  Patient reports good fetal movement, denies any bleeding and no rupture of membranes symptoms or regular contractions. Patient is without complaints. All questions were answered.  Orders Placed This Encounter  Procedures  . POCT urinalysis dipstick    Plan:  Continued routine obstetrical care,   No Follow-up on file.

## 2015-09-01 ENCOUNTER — Ambulatory Visit (INDEPENDENT_AMBULATORY_CARE_PROVIDER_SITE_OTHER): Payer: Medicaid Other | Admitting: Obstetrics & Gynecology

## 2015-09-01 ENCOUNTER — Encounter: Payer: Self-pay | Admitting: Obstetrics & Gynecology

## 2015-09-01 VITALS — BP 110/60 | HR 80 | Wt 132.0 lb

## 2015-09-01 DIAGNOSIS — Z1389 Encounter for screening for other disorder: Secondary | ICD-10-CM

## 2015-09-01 DIAGNOSIS — Z3493 Encounter for supervision of normal pregnancy, unspecified, third trimester: Secondary | ICD-10-CM

## 2015-09-01 DIAGNOSIS — Z3A37 37 weeks gestation of pregnancy: Secondary | ICD-10-CM

## 2015-09-01 DIAGNOSIS — Z118 Encounter for screening for other infectious and parasitic diseases: Secondary | ICD-10-CM

## 2015-09-01 DIAGNOSIS — Z331 Pregnant state, incidental: Secondary | ICD-10-CM

## 2015-09-01 DIAGNOSIS — Z1159 Encounter for screening for other viral diseases: Secondary | ICD-10-CM

## 2015-09-01 DIAGNOSIS — Z3685 Encounter for antenatal screening for Streptococcus B: Secondary | ICD-10-CM

## 2015-09-01 LAB — POCT URINALYSIS DIPSTICK
Glucose, UA: NEGATIVE
Ketones, UA: NEGATIVE
Nitrite, UA: NEGATIVE

## 2015-09-01 NOTE — Progress Notes (Signed)
[id number] Estimated Date of Delivery: 09/23/15  Blood pressure 110/60, pulse 80, weight 132 lb (59.875 kg), last menstrual period 12/24/2014, unknown if currently breastfeeding.   BP weight and urine results all reviewed and noted.  Please refer to the obstetrical flow sheet for the fundal height and fetal heart rate documentation:  Patient reports good fetal movement, denies any bleeding and no rupture of membranes symptoms or regular contractions. Patient is without complaints. All questions were answered.  Orders Placed This Encounter  Procedures  . GC/Chlamydia Probe Amp  . Strep Gp B NAA  . POCT urinalysis dipstick    Plan:  Continued routine obstetrical care,   No Follow-up on file.

## 2015-09-03 LAB — GC/CHLAMYDIA PROBE AMP
Chlamydia trachomatis, NAA: NEGATIVE
Neisseria gonorrhoeae by PCR: NEGATIVE

## 2015-09-03 LAB — STREP GP B NAA: STREP GROUP B AG: NEGATIVE

## 2015-09-06 ENCOUNTER — Ambulatory Visit (INDEPENDENT_AMBULATORY_CARE_PROVIDER_SITE_OTHER): Payer: Medicaid Other | Admitting: Obstetrics and Gynecology

## 2015-09-06 VITALS — BP 112/66 | HR 82 | Wt 132.0 lb

## 2015-09-06 DIAGNOSIS — Z3493 Encounter for supervision of normal pregnancy, unspecified, third trimester: Secondary | ICD-10-CM

## 2015-09-06 DIAGNOSIS — Z1389 Encounter for screening for other disorder: Secondary | ICD-10-CM

## 2015-09-06 DIAGNOSIS — Z331 Pregnant state, incidental: Secondary | ICD-10-CM

## 2015-09-06 LAB — POCT URINALYSIS DIPSTICK
Glucose, UA: NEGATIVE
Ketones, UA: NEGATIVE
Leukocytes, UA: NEGATIVE
Nitrite, UA: NEGATIVE
PROTEIN UA: 1

## 2015-09-06 NOTE — Progress Notes (Addendum)
Patient ID: Jeanne Hernandez, female   DOB: 1990/04/12, 25 y.o.   MRN: [id number]  [id number] [redacted]w[redacted]d Estimated Date of Delivery: 09/23/15 LROB  Patient reports  + good fetal movement, denies any bleeding and no rupture of membranes symptoms or regular contractions. Patient complaints: Pt c/o constant, sharp right lower pelvic pain. Pt also reports constant pain through her right pain, not alleviated with changing position. She notes constant headache lasting 2 days onset 4 days ago, currently alleviated. Pt also c/o red bumps on her abdomen; bumps are not itchy. She saw her doctor who said her estrogen levels were high, likely causing the bumps. Denies dysuria.  Blood pressure 112/66, pulse 82, weight 132 lb (59.875 kg), last menstrual period 12/24/2014, unknown if currently breastfeeding.  refer to the ob flow sheet for FH and FHR, also BP, Wt, Urine results: notable for trace blood and 1 protein                          Physical Examination: General appearance - alert, well appearing, and in no distress, oriented to person,      place, and time and normal appearing weight                                      Abdomen - FH 36 cm                                                         -FHR 132 bpm                                                         soft, nontender, nondistended, no masses or organomegaly                                      Pelvic - VULVA: normal appearing vulva with no masses, tenderness or lesions,      VAGINA: normal appearing vagina with normal color and discharge, no lesions,      CERVIX: normal appearing cervix without discharge or lesions,      UTERUS: uterus is normal size, shape, consistency and nontender                                            Questions were answered. Assessment: LROB [id number] @ [redacted]w[redacted]d  Plan:  Continued routine obstetrical care pt declines cervix ccheck today  F/u in 1 weeks for regular pnx care   By signing my name below, I, Marisue Humble,  attest that this documentation has been prepared under the direction and in the presence of Tilda Burrow, MD . Electronically Signed: Marisue Humble, Scribe. 09/06/2015. 4:21 PM.  I personally performed the services described in this documentation, which was SCRIBED in my presence. The recorded information has been reviewed and considered accurate. It has been edited  as necessary during review. Tilda BurrowFERGUSON,JOHN V, MD             Patient ID: Jeanne Hernandez, female   DOB: 12/19/1990, 25 y.o.   MRN: [id number]  [id number] 2240w4d Estimated Date of Delivery: 09/23/15 LROB  Patient reports  + good fetal movement, denies any bleeding and no rupture of membranes symptoms or regular contractions. Patient complaints: Pt c/o constant, sharp right lower pelvic pain. Pt also reports constant pain through her right pain, not alleviated with changing position. She notes constant headache lasting 2 days onset 4 days ago, currently alleviated. Pt also c/o red bumps on her abdomen; bumps are not itchy. She saw her doctor who said her estrogen levels were high, likely causing the bumps. Denies dysuria.  Blood pressure 112/66, pulse 82, weight 132 lb (59.875 kg), last menstrual period 12/24/2014, unknown if currently breastfeeding.  refer to the ob flow sheet for FH and FHR, also BP, Wt, Urine results: notable for trace blood and 1 protein                          Physical Examination: General appearance - alert, well appearing, and in no distress, oriented to person,      place, and time and normal appearing weight                                      Abdomen - FH 36 cm                                                         -FHR 132 bpm                                                         soft, nontender, nondistended, no masses or organomegaly                                      Pelvic - VULVA: normal appearing vulva with no masses, tenderness or lesions,      VAGINA: normal appearing vagina with normal  color and discharge, no lesions,      CERVIX: normal appearing cervix without discharge or lesions,      UTERUS: uterus is normal size, shape, consistency and nontender                                            Questions were answered. Assessment: LROB [id number] @ 7240w4d  Plan:  Continued routine obstetrical care pt declines cervix ccheck today  F/u in 1 weeks for regular pnx care   By signing my name below, I, Marisue HumbleMichelle Chaffee, attest that this documentation has been prepared under the direction and in the presence of Tilda BurrowJohn V Ferguson, MD . Electronically Signed: Marisue HumbleMichelle Chaffee, Scribe. 09/06/2015. 4:21 PM.  I personally performed the services described in this documentation,  which was SCRIBED in my presence. The recorded information has been reviewed and considered accurate. It has been edited as necessary during review. Tilda BurrowFERGUSON,JOHN V, MD

## 2015-09-14 ENCOUNTER — Ambulatory Visit (INDEPENDENT_AMBULATORY_CARE_PROVIDER_SITE_OTHER): Payer: Medicaid Other | Admitting: Obstetrics & Gynecology

## 2015-09-14 ENCOUNTER — Encounter: Payer: Self-pay | Admitting: Obstetrics & Gynecology

## 2015-09-14 VITALS — BP 114/60 | HR 94 | Wt 129.0 lb

## 2015-09-14 DIAGNOSIS — Z3483 Encounter for supervision of other normal pregnancy, third trimester: Secondary | ICD-10-CM

## 2015-09-14 DIAGNOSIS — Z3A39 39 weeks gestation of pregnancy: Secondary | ICD-10-CM

## 2015-09-14 DIAGNOSIS — Z3493 Encounter for supervision of normal pregnancy, unspecified, third trimester: Secondary | ICD-10-CM

## 2015-09-14 DIAGNOSIS — Z1389 Encounter for screening for other disorder: Secondary | ICD-10-CM

## 2015-09-14 DIAGNOSIS — Z331 Pregnant state, incidental: Secondary | ICD-10-CM

## 2015-09-14 LAB — POCT URINALYSIS DIPSTICK
GLUCOSE UA: NEGATIVE
Ketones, UA: NEGATIVE
LEUKOCYTES UA: NEGATIVE
NITRITE UA: NEGATIVE

## 2015-09-14 NOTE — Progress Notes (Signed)
[id number] 3220w5d Estimated Date of Delivery: 09/23/15  Blood pressure 114/60, pulse 94, weight 129 lb (58.514 kg), last menstrual period 12/24/2014, unknown if currently breastfeeding.   BP weight and urine results all reviewed and noted.  Please refer to the obstetrical flow sheet for the fundal height and fetal heart rate documentation:  Patient reports good fetal movement, denies any bleeding and no rupture of membranes symptoms or regular contractions. Patient is without complaints. All questions were answered.  Orders Placed This Encounter  Procedures  . POCT urinalysis dipstick    Plan:  Continued routine obstetrical care,   Return in about 1 week (around 09/21/2015) for LROB.

## 2015-09-21 ENCOUNTER — Encounter: Payer: Medicaid Other | Admitting: Obstetrics & Gynecology

## 2015-09-22 ENCOUNTER — Encounter: Payer: Self-pay | Admitting: Women's Health

## 2015-09-22 ENCOUNTER — Ambulatory Visit (INDEPENDENT_AMBULATORY_CARE_PROVIDER_SITE_OTHER): Payer: Medicaid Other | Admitting: Women's Health

## 2015-09-22 VITALS — BP 110/60 | HR 98 | Wt 131.0 lb

## 2015-09-22 DIAGNOSIS — Z1389 Encounter for screening for other disorder: Secondary | ICD-10-CM

## 2015-09-22 DIAGNOSIS — F1290 Cannabis use, unspecified, uncomplicated: Secondary | ICD-10-CM

## 2015-09-22 DIAGNOSIS — Z331 Pregnant state, incidental: Secondary | ICD-10-CM

## 2015-09-22 DIAGNOSIS — Z3493 Encounter for supervision of normal pregnancy, unspecified, third trimester: Secondary | ICD-10-CM

## 2015-09-22 LAB — POCT URINALYSIS DIPSTICK
Glucose, UA: NEGATIVE
Ketones, UA: NEGATIVE
Nitrite, UA: NEGATIVE
PROTEIN UA: NEGATIVE

## 2015-09-22 NOTE — Progress Notes (Signed)
Low-risk OB appointment [id number] 7680w6d Estimated Date of Delivery: 09/23/15 BP 110/60 mmHg  Pulse 98  Wt 131 lb (59.421 kg)  LMP 12/24/2014 (Approximate)  BP, weight, and urine reviewed.  Refer to obstetrical flow sheet for FH & FHR.  Reports good fm.  Denies regular uc's, lof, vb, or uti s/s. No complaints. SVE per request: 3/th/-2, vtx  Pt wants membranes swept, discussed risks/benefits- desires to proceed, membranes swept Reviewed labor s/s, fkc. Plan:  Continue routine obstetrical care  F/U in 1wk for OB appointment  IOL for postdates scheduled for 7/27 @ 0630 if needed

## 2015-09-23 ENCOUNTER — Telehealth (HOSPITAL_COMMUNITY): Payer: Self-pay | Admitting: *Deleted

## 2015-09-23 NOTE — Telephone Encounter (Signed)
Preadmission screen  

## 2015-09-24 LAB — PMP SCREEN PROFILE (10S), URINE
Amphetamine Screen, Ur: NEGATIVE ng/mL
Barbiturate Screen, Ur: NEGATIVE ng/mL
Benzodiazepine Screen, Urine: NEGATIVE ng/mL
CANNABINOIDS UR QL SCN: NEGATIVE ng/mL
Cocaine(Metab.)Screen, Urine: NEGATIVE ng/mL
Creatinine(Crt), U: 122.6 mg/dL (ref 20.0–300.0)
METHADONE SCREEN, URINE: NEGATIVE ng/mL
OXYCODONE+OXYMORPHONE UR QL SCN: NEGATIVE ng/mL
Opiate Scrn, Ur: NEGATIVE ng/mL
PCP Scrn, Ur: NEGATIVE ng/mL
Ph of Urine: 7 (ref 4.5–8.9)
Propoxyphene, Screen: NEGATIVE ng/mL

## 2015-09-29 ENCOUNTER — Encounter (HOSPITAL_COMMUNITY): Payer: Self-pay | Admitting: *Deleted

## 2015-09-29 ENCOUNTER — Inpatient Hospital Stay (HOSPITAL_COMMUNITY): Payer: Medicaid Other | Admitting: Anesthesiology

## 2015-09-29 ENCOUNTER — Ambulatory Visit (INDEPENDENT_AMBULATORY_CARE_PROVIDER_SITE_OTHER): Payer: Medicaid Other | Admitting: Obstetrics & Gynecology

## 2015-09-29 ENCOUNTER — Inpatient Hospital Stay (HOSPITAL_COMMUNITY)
Admission: AD | Admit: 2015-09-29 | Discharge: 2015-10-01 | DRG: 775 | Disposition: A | Discharge status: Home / Self Care | Payer: Medicaid Other | Source: Ambulatory Visit | Attending: Obstetrics and Gynecology | Admitting: Obstetrics and Gynecology

## 2015-09-29 VITALS — BP 118/70 | HR 94 | Wt 131.3 lb

## 2015-09-29 DIAGNOSIS — Z1389 Encounter for screening for other disorder: Secondary | ICD-10-CM

## 2015-09-29 DIAGNOSIS — O480 Post-term pregnancy: Secondary | ICD-10-CM

## 2015-09-29 DIAGNOSIS — Z141 Cystic fibrosis carrier: Secondary | ICD-10-CM

## 2015-09-29 DIAGNOSIS — Z3A41 41 weeks gestation of pregnancy: Secondary | ICD-10-CM

## 2015-09-29 DIAGNOSIS — Z331 Pregnant state, incidental: Secondary | ICD-10-CM

## 2015-09-29 DIAGNOSIS — IMO0001 Reserved for inherently not codable concepts without codable children: Secondary | ICD-10-CM

## 2015-09-29 DIAGNOSIS — F17210 Nicotine dependence, cigarettes, uncomplicated: Secondary | ICD-10-CM | POA: Diagnosis present

## 2015-09-29 DIAGNOSIS — F1290 Cannabis use, unspecified, uncomplicated: Secondary | ICD-10-CM | POA: Diagnosis present

## 2015-09-29 DIAGNOSIS — O99344 Other mental disorders complicating childbirth: Secondary | ICD-10-CM | POA: Diagnosis present

## 2015-09-29 DIAGNOSIS — F418 Other specified anxiety disorders: Secondary | ICD-10-CM | POA: Diagnosis present

## 2015-09-29 DIAGNOSIS — Z3483 Encounter for supervision of other normal pregnancy, third trimester: Secondary | ICD-10-CM | POA: Diagnosis present

## 2015-09-29 DIAGNOSIS — Z3A40 40 weeks gestation of pregnancy: Secondary | ICD-10-CM

## 2015-09-29 DIAGNOSIS — O99324 Drug use complicating childbirth: Secondary | ICD-10-CM | POA: Diagnosis present

## 2015-09-29 DIAGNOSIS — O99334 Smoking (tobacco) complicating childbirth: Secondary | ICD-10-CM | POA: Diagnosis present

## 2015-09-29 DIAGNOSIS — Z3493 Encounter for supervision of normal pregnancy, unspecified, third trimester: Secondary | ICD-10-CM

## 2015-09-29 LAB — CBC
HEMATOCRIT: 29.9 % — AB (ref 36.0–46.0)
Hemoglobin: 10.2 g/dL — ABNORMAL LOW (ref 12.0–15.0)
MCH: 32.5 pg (ref 26.0–34.0)
MCHC: 34.1 g/dL (ref 30.0–36.0)
MCV: 95.2 fL (ref 78.0–100.0)
PLATELETS: 297 10*3/uL (ref 150–400)
RBC: 3.14 MIL/uL — AB (ref 3.87–5.11)
RDW: 14.2 % (ref 11.5–15.5)
WBC: 21.3 10*3/uL — AB (ref 4.0–10.5)

## 2015-09-29 LAB — POCT URINALYSIS DIPSTICK
GLUCOSE UA: NEGATIVE
KETONES UA: NEGATIVE
Leukocytes, UA: NEGATIVE
Nitrite, UA: NEGATIVE
RBC UA: NEGATIVE

## 2015-09-29 LAB — TYPE AND SCREEN
ABO/RH(D): O POS
ANTIBODY SCREEN: NEGATIVE

## 2015-09-29 MED ORDER — OXYTOCIN BOLUS FROM INFUSION: 500.0000 mL | Freq: Once | INTRAVENOUS | Status: DC

## 2015-09-29 MED ORDER — DIPHENHYDRAMINE HCL 50 MG/ML IJ SOLN: 12.5000 mg | INTRAMUSCULAR | Status: DC | PRN

## 2015-09-29 MED ORDER — EPHEDRINE 5 MG/ML INJ
10.0000 mg | INTRAVENOUS | Status: DC | PRN
  Filled 2015-09-29: qty 4

## 2015-09-29 MED ORDER — LIDOCAINE HCL (PF) 1 % IJ SOLN
30.0000 mL | INTRAMUSCULAR | Status: AC | PRN
  Administered 2015-09-29: 4 mL via SUBCUTANEOUS
  Filled 2015-09-29: qty 30

## 2015-09-29 MED ORDER — ONDANSETRON HCL 4 MG/2ML IJ SOLN: 4.0000 mg | Freq: Four times a day (QID) | INTRAMUSCULAR | Status: DC | PRN

## 2015-09-29 MED ORDER — LACTATED RINGERS IV SOLN: 500.0000 mL | INTRAVENOUS | Status: DC | PRN

## 2015-09-29 MED ORDER — SOD CITRATE-CITRIC ACID 500-334 MG/5ML PO SOLN
30.0000 mL | ORAL | Status: DC | PRN
  Administered 2015-09-30: 30 mL via ORAL
  Filled 2015-09-29: qty 15

## 2015-09-29 MED ORDER — LACTATED RINGERS IV SOLN
INTRAVENOUS | Status: DC
  Administered 2015-09-30: via INTRAVENOUS

## 2015-09-29 MED ORDER — PHENYLEPHRINE 40 MCG/ML (10ML) SYRINGE FOR IV PUSH (FOR BLOOD PRESSURE SUPPORT)
80.0000 ug | PREFILLED_SYRINGE | INTRAVENOUS | Status: DC | PRN
  Filled 2015-09-29: qty 5

## 2015-09-29 MED ORDER — OXYTOCIN 40 UNITS IN LACTATED RINGERS INFUSION - SIMPLE MED
2.5000 [IU]/h | INTRAVENOUS | Status: DC
  Filled 2015-09-29: qty 1000

## 2015-09-29 MED ORDER — PHENYLEPHRINE 40 MCG/ML (10ML) SYRINGE FOR IV PUSH (FOR BLOOD PRESSURE SUPPORT)
80.0000 ug | PREFILLED_SYRINGE | INTRAVENOUS | Status: DC | PRN
  Filled 2015-09-29: qty 10
  Filled 2015-09-29: qty 5

## 2015-09-29 MED ORDER — LACTATED RINGERS IV SOLN: 500.0000 mL | Freq: Once | INTRAVENOUS | Status: DC

## 2015-09-29 MED ORDER — FENTANYL 2.5 MCG/ML BUPIVACAINE 1/10 % EPIDURAL INFUSION (WH - ANES)
14.0000 mL/h | INTRAMUSCULAR | Status: DC | PRN
  Administered 2015-09-29: 14 mL/h via EPIDURAL
  Filled 2015-09-29: qty 125

## 2015-09-29 MED ORDER — OXYCODONE-ACETAMINOPHEN 5-325 MG PO TABS
2.0000 | ORAL_TABLET | ORAL | Status: DC | PRN
  Administered 2015-09-30 – 2015-10-01 (×8): 2 via ORAL
  Filled 2015-09-29 (×8): qty 2

## 2015-09-29 MED ORDER — ACETAMINOPHEN 325 MG PO TABS: 650.0000 mg | ORAL_TABLET | ORAL | Status: DC | PRN

## 2015-09-29 MED ORDER — OXYCODONE-ACETAMINOPHEN 5-325 MG PO TABS
1.0000 | ORAL_TABLET | ORAL | Status: DC | PRN
  Administered 2015-09-30: 1 via ORAL
  Filled 2015-09-29: qty 1

## 2015-09-29 NOTE — Progress Notes (Signed)
Notified of pt arrival in MAU and exam. Will admit to labor and delivery.  

## 2015-09-29 NOTE — MAU Note (Signed)
Contractions. Denies LOF. Was 3cm in office today

## 2015-09-29 NOTE — H&P (Signed)
LABOR AND DELIVERY ADMISSION HISTORY AND PHYSICAL NOTE  Jeanne Hernandez is a 25 y.o. female [phone] with IUP at [redacted]w[redacted]d by 7wk [place] presenting for SOOL.   She reports positive fetal movement. She denies leakage of fluid or vaginal bleeding.  Prenatal History/Complications:  Past Medical History: Past Medical History:  Diagnosis Date  . Anemia   . Anxiety   . Back pain in pregnancy 10/09/2012  . Cystic fibrosis gene carrier   . Depression     Past Surgical History: Past Surgical History:  Procedure Laterality Date  . APPENDECTOMY      Obstetrical History: OB History    Gravida Para Term Preterm AB Living   6 4 4   1 4    SAB TAB Ectopic Multiple Live Births   1       4      Social History: Social History   Social History  . Marital status: Single    Spouse name: N/A  . Number of children: N/A  . Years of education: N/A   Social History Main Topics  . Smoking status: Current Every Day Smoker    Packs/day: 0.50    Years: 5.00    Types: Cigarettes  . Smokeless tobacco: Never Used  . Alcohol use No  . Drug use: No     Comment: UDS +THC 07/04/10  . Sexual activity: Yes    Birth control/ protection: None     Comment: Depo injection q 3 months, last taken 03/2011   Other Topics Concern  . None   Social History Narrative  . None    Family History: History reviewed. No pertinent family history.  Allergies: No Known Allergies  Prescriptions Prior to Admission  Medication Sig Dispense Refill Last Dose  . acetaminophen (TYLENOL) 500 MG tablet Take 1,000 mg by mouth as needed. Reported on 09/14/2015   Taking  . ferrous sulfate 325 (65 FE) MG tablet Take 1 tablet (325 mg total) by mouth 2 (two) times daily with a meal. 60 tablet 3 Taking  . omeprazole (PRILOSEC) 20 MG capsule Take 1 capsule (20 mg total) by mouth daily. 1 tablet a day 30 capsule 6 Taking  . Pediatric Multiple Vit-C-FA (FLINSTONES GUMMIES OMEGA-3 DHA PO) Take by mouth. Takes 2 daily   Taking      Review of Systems   All systems reviewed and negative except as stated in HPI  Last menstrual period 12/24/2014, unknown if currently breastfeeding. General appearance: alert, cooperative and no distress Lungs: clear to auscultation bilaterally Heart: regular rate and rhythm Abdomen: soft, non-tender; bowel sounds normal Extremities: No calf swelling or tenderness Presentation: cephalic Fetal monitoring: cat 1 Uterine activity: ctz q20min Dilation: 5.5 Effacement (%): 90 Station: -1 Exam by:: Camelia Eng RN   Prenatal labs: ABO, Rh: O/Positive/-- (01/23 1546) Antibody: Negative (05/19 0826) Rubella: !Error! RPR: Non Reactive (05/19 0826)  HBsAg: Negative (01/23 1546)  HIV: Non Reactive (05/19 0826)  GBS: Negative (06/28 1630)  1 hr Glucola: 2 hr normal Genetic screening:  Too late for NT, AFP neg Anatomy US: normal female  Prenatal Transfer Tool  Maternal Diabetes: No Genetic Screening: Normal Maternal Ultrasounds/Referrals: Normal Fetal Ultrasounds or other Referrals:  None Maternal Substance Abuse:  Yes:  Type: Smoker, Marijuana Significant Maternal Medications:  None Significant Maternal Lab Results: None  Results for orders placed or performed in visit on 09/29/15 (from the past 24 hour(s))  POCT urinalysis dipstick   Collection Time: 09/29/15  3:19 PM  Result Value Ref Range  Color, UA     Clarity, UA     Glucose, UA neg    Bilirubin, UA     Ketones, UA neg    Spec Grav, UA     Blood, UA neg    pH, UA     Protein, UA trace    Urobilinogen, UA     Nitrite, UA neg    Leukocytes, UA Negative Negative    Patient Active Problem List   Diagnosis Date Noted  . Active labor at term 09/29/2015  . Marijuana use 03/30/2015  . Cystic fibrosis carrier 03/29/2015  . Supervision of normal pregnancy 03/29/2015  . Smoker 03/29/2015  . Boils 03/29/2015  . Vaginitis and vulvovaginitis, unspecified 11/21/2012  . Back pain in pregnancy 10/09/2012  .  Bipolar disorder current episode depressed (HCC) 05/04/2011    Class: Acute    Assessment: Jeanne Hernandez is a 25 y.o. [id number] at [redacted]w[redacted]d here for SOOL.  #Labor: expectant management #Pain: Desires epidural #FWB: Cat 1 #ID:  GBS neg #MOF: bottle #MOC:nexplanon   Loni Muse 09/29/2015, 9:46 PM   I have seen and examined this patient and I agree with the above. See other copy of H&P. SHAW, KIMBERLY 9:00 AM 09/30/2015

## 2015-09-29 NOTE — Progress Notes (Signed)
[id number] [redacted]w[redacted]d Estimated Date of Delivery: 09/23/15  Blood pressure 118/70, pulse 94, weight 131 lb 4.8 oz (59.6 kg), last menstrual period 12/24/2014, unknown if currently breastfeeding.   BP weight and urine results all reviewed and noted.  Please refer to the obstetrical flow sheet for the fundal height and fetal heart rate documentation:  Patient reports good fetal movement, denies any bleeding and no rupture of membranes symptoms or regular contractions. Patient is without complaints. All questions were answered.  Orders Placed This Encounter  Procedures  . POCT urinalysis dipstick    Plan:  Continued routine obstetrical care, induction tomorrow  Return in about 6 weeks (around 11/10/2015) for post partum visit.

## 2015-09-29 NOTE — H&P (Signed)
OBSTETRIC ADMISSION HISTORY AND PHYSICAL  Jeanne Hernandez is a 25 y.o. female [phone] with IUP at [redacted]w[redacted]d by 7wk u/s presenting for labor. She reports +FMs, No LOF, no VB, no blurry vision, headaches or peripheral edema, and RUQ pain.  She plans on bottle feeding. She requests nexplanon for birth control. Measured 3cm in clinic today.   Dating: By Charma Igo u/s --->  Estimated Date of Delivery: 09/23/15  Sono:    , CWD, normal anatomy, cephalic presentation, 348g   Prenatal History/Complications:  Past Medical History: Past Medical History:  Diagnosis Date  . Anemia   . Anxiety   . Back pain in pregnancy 10/09/2012  . Cystic fibrosis gene carrier   . Depression     Past Surgical History: Past Surgical History:  Procedure Laterality Date  . APPENDECTOMY      Obstetrical History: OB History    Gravida Para Term Preterm AB Living   SAB TAB Ectopic Multiple Live Births   1       4      Social History: Social History   Social History  . Marital status: Single    Spouse name: N/A  . Number of children: N/A  . Years of education: N/A   Social History Main Topics  . Smoking status: Current Every Day Smoker    Packs/day: 0.50    Years: 5.00    Types: Cigarettes  . Smokeless tobacco: Never Used  . Alcohol use No  . Drug use: No     Comment: UDS +THC 07/04/10  . Sexual activity: Yes    Birth control/ protection: None     Comment: Depo injection q 3 months, last taken 03/2011   Other Topics Concern  . None   Social History Narrative  . None    Family History: History reviewed. No pertinent family history.  Allergies: No Known Allergies  Prescriptions Prior to Admission  Medication Sig Dispense Refill Last Dose  . acetaminophen (TYLENOL) 325 MG tablet Take 650 mg by mouth every 6 (six) hours as needed for mild pain or headache.   09/29/2015 at Unknown time  . ferrous sulfate 325 (65 FE) MG tablet Take 1 tablet (325 mg total) by mouth 2 (two) times  daily with a meal. 60 tablet 3 Past Week at Unknown time  . omeprazole (PRILOSEC) 20 MG capsule Take 1 capsule (20 mg total) by mouth daily. 1 tablet a day 30 capsule 6 Past Week at Unknown time  . Pediatric Multiple Vit-C-FA (FLINSTONES GUMMIES OMEGA-3 DHA PO) Take by mouth. Takes 2 daily   09/29/2015 at Unknown time     Review of Systems   All systems reviewed and negative except as stated in HPI  Temperature 98.4 F (36.9 C), temperature source Oral, resp. rate 16, height 5' 2.5" (1.588 m), weight 59.9 kg (132 lb), last menstrual period 12/24/2014, unknown if currently breastfeeding. General appearance: alert, cooperative and mild distress Lungs: clear to auscultation bilaterally Heart: regular rate and rhythm Abdomen: soft, non-tender; bowel sounds normal Extremities: Homans sign is negative, no sign of DVT Presentation: cephalic Fetal monitoringBaseline: 150 bpm, Variability: Good {> 6 bpm) and Accelerations: Reactive Uterine activityDate/time of onset: 17:30 and Frequency: Every 2-4 minutes Dilation: 5.5 Effacement (%): 90 Station: -1 Exam by:: Jeanne Eng RN   Prenatal labs: ABO, Rh: O/Positive/-- (01/23 1546) Antibody: Negative (05/19 0826) Rubella: 4.90 (01/23 1546) RPR: Non Reactive (05/19 0826)  HBsAg: Negative (01/23 1546)  HIV:  Non Reactive (05/19 0826)  GBS: Negative (06/28 1630)  1 hr Glucola 149 Genetic screening  n/a Anatomy US normal  Prenatal Transfer Tool  Maternal Diabetes: No Genetic Screening: Declined Maternal Ultrasounds/Referrals: Normal Fetal Ultrasounds or other Referrals:  None Maternal Substance Abuse:  Yes:  Type: Smoker, Marijuana Significant Maternal Medications:  None Significant Maternal Lab Results: Lab values include: Group B Strep negative  Results for orders placed or performed during the hospital encounter of 09/29/15 (from the past 24 hour(s))  CBC     Status: Abnormal   Collection Time: 09/29/15  9:25 PM  Result Value Ref  Range   WBC 21.3 (H) 4.0 - 10.5 K/uL   RBC 3.14 (L) 3.87 - 5.11 MIL/uL   Hemoglobin 10.2 (L) 12.0 - 15.0 g/dL   HCT 26.8 (L) 34.1 - 96.2 %   MCV 95.2 78.0 - 100.0 fL   MCH 32.5 26.0 - 34.0 pg   MCHC 34.1 30.0 - 36.0 g/dL   RDW 22.9 79.8 - 92.1 %   Platelets 297 150 - 400 K/uL    Results for orders placed or performed during the hospital encounter of 09/29/15 (from the past 24 hour(s))  CBC   Collection Time: 09/29/15  9:25 PM  Result Value Ref Range   WBC 21.3 (H) 4.0 - 10.5 K/uL   RBC 3.14 (L) 3.87 - 5.11 MIL/uL   Hemoglobin 10.2 (L) 12.0 - 15.0 g/dL   HCT 19.4 (L) 17.4 - 08.1 %   MCV 95.2 78.0 - 100.0 fL   MCH 32.5 26.0 - 34.0 pg   MCHC 34.1 30.0 - 36.0 g/dL   RDW 44.8 18.5 - 63.1 %   Platelets 297 150 - 400 K/uL  Results for orders placed or performed in visit on 09/29/15 (from the past 24 hour(s))  POCT urinalysis dipstick   Collection Time: 09/29/15  3:19 PM  Result Value Ref Range   Color, UA     Clarity, UA     Glucose, UA neg    Bilirubin, UA     Ketones, UA neg    Spec Grav, UA     Blood, UA neg    pH, UA     Protein, UA trace    Urobilinogen, UA     Nitrite, UA neg    Leukocytes, UA Negative Negative    Patient Active Problem List   Diagnosis Date Noted  . Active labor at term 09/29/2015  . Marijuana use 03/30/2015  . Cystic fibrosis carrier 03/29/2015  . Supervision of normal pregnancy 03/29/2015  . Smoker 03/29/2015  . Boils 03/29/2015  . Vaginitis and vulvovaginitis, unspecified 11/21/2012  . Back pain in pregnancy 10/09/2012  . Bipolar disorder current episode depressed (HCC) 05/04/2011    Class: Acute    Assessment/Plan:  Jeanne Hernandez is a 25 y.o. [id number] at [redacted]w[redacted]d here for onset of labor.   #Labor: expectant management #Pain: Wants epidural #FWB: Cat 1 #ID:  GBS neg  #MOF: bottle #MOC: nexplanon #Circ: n/a  Jeanne Hernandez, Medical Student  09/29/2015, 10:11 PM  Loni Muse   CNM attestation:  I have seen and examined this  patient; I agree with above documentation in the resident's note.   Jeanne Hernandez is a 25 y.o. [id number] here for SOL  PE: BP 129/72   Pulse 82   Temp 98.8 F (37.1 C) (Oral)   Resp 16   Ht 5' 2.5" (1.588 m)   Wt 59.9 kg (132 lb)   LMP 12/24/2014 (Approximate)  SpO2 97%   BMI 23.76 kg/m   Resp: normal effort, no distress Abd: gravid  ROS, labs, PMH reviewed  Plan: Admit to [facility]  Expectant management Plans epidural Anticipate SVD  Jeanne Hernandez CNM 09/30/2015, 1:09 AM      ROS Physical Exam

## 2015-09-30 ENCOUNTER — Inpatient Hospital Stay (HOSPITAL_COMMUNITY): Admission: RE | Admit: 2015-09-30 | Payer: Medicaid Other | Source: Ambulatory Visit

## 2015-09-30 ENCOUNTER — Encounter (HOSPITAL_COMMUNITY): Payer: Self-pay

## 2015-09-30 DIAGNOSIS — O99324 Drug use complicating childbirth: Secondary | ICD-10-CM

## 2015-09-30 DIAGNOSIS — F1290 Cannabis use, unspecified, uncomplicated: Secondary | ICD-10-CM

## 2015-09-30 DIAGNOSIS — Z3A40 40 weeks gestation of pregnancy: Secondary | ICD-10-CM

## 2015-09-30 DIAGNOSIS — Z141 Cystic fibrosis carrier: Secondary | ICD-10-CM

## 2015-09-30 DIAGNOSIS — F17210 Nicotine dependence, cigarettes, uncomplicated: Secondary | ICD-10-CM

## 2015-09-30 DIAGNOSIS — O99344 Other mental disorders complicating childbirth: Secondary | ICD-10-CM

## 2015-09-30 LAB — RPR: RPR Ser Ql: NONREACTIVE

## 2015-09-30 LAB — ABO/RH: ABO/RH(D): O POS

## 2015-09-30 MED ORDER — PRENATAL MULTIVITAMIN CH
1.0000 | ORAL_TABLET | Freq: Every day | ORAL | Status: DC
  Administered 2015-09-30 – 2015-10-01 (×2): 1 via ORAL
  Filled 2015-09-30 (×2): qty 1

## 2015-09-30 MED ORDER — COCONUT OIL OIL: 1.0000 "application " | TOPICAL_OIL | Status: DC | PRN

## 2015-09-30 MED ORDER — DIBUCAINE 1 % RE OINT: 1.0000 "application " | TOPICAL_OINTMENT | RECTAL | Status: DC | PRN

## 2015-09-30 MED ORDER — ZOLPIDEM TARTRATE 5 MG PO TABS: 5.0000 mg | ORAL_TABLET | Freq: Every evening | ORAL | Status: DC | PRN

## 2015-09-30 MED ORDER — TETANUS-DIPHTH-ACELL PERTUSSIS 5-2.5-18.5 LF-MCG/0.5 IM SUSP
0.5000 mL | Freq: Once | INTRAMUSCULAR | Status: AC
  Administered 2015-10-01: 0.5 mL via INTRAMUSCULAR
  Filled 2015-09-30: qty 0.5

## 2015-09-30 MED ORDER — WITCH HAZEL-GLYCERIN EX PADS: 1.0000 "application " | MEDICATED_PAD | CUTANEOUS | Status: DC | PRN

## 2015-09-30 MED ORDER — IBUPROFEN 600 MG PO TABS
600.0000 mg | ORAL_TABLET | Freq: Four times a day (QID) | ORAL | Status: DC
  Administered 2015-09-30 – 2015-10-01 (×6): 600 mg via ORAL
  Filled 2015-09-30 (×6): qty 1

## 2015-09-30 MED ORDER — ACETAMINOPHEN 325 MG PO TABS: 650.0000 mg | ORAL_TABLET | ORAL | Status: DC | PRN

## 2015-09-30 MED ORDER — BENZOCAINE-MENTHOL 20-0.5 % EX AERO: 1.0000 "application " | INHALATION_SPRAY | CUTANEOUS | Status: DC | PRN

## 2015-09-30 MED ORDER — ONDANSETRON HCL 4 MG PO TABS
4.0000 mg | ORAL_TABLET | ORAL | Status: DC | PRN
  Administered 2015-10-01: 4 mg via ORAL
  Filled 2015-09-30: qty 1

## 2015-09-30 MED ORDER — MISOPROSTOL 200 MCG PO TABS
600.0000 ug | ORAL_TABLET | Freq: Once | ORAL | Status: AC
  Administered 2015-09-30: 600 ug via ORAL
  Filled 2015-09-30: qty 3

## 2015-09-30 MED ORDER — ONDANSETRON HCL 4 MG/2ML IJ SOLN: 4.0000 mg | INTRAMUSCULAR | Status: DC | PRN

## 2015-09-30 MED ORDER — DIPHENHYDRAMINE HCL 25 MG PO CAPS: 25.0000 mg | ORAL_CAPSULE | Freq: Four times a day (QID) | ORAL | Status: DC | PRN

## 2015-09-30 MED ORDER — SIMETHICONE 80 MG PO CHEW: 80.0000 mg | CHEWABLE_TABLET | ORAL | Status: DC | PRN

## 2015-09-30 MED ORDER — SENNOSIDES-DOCUSATE SODIUM 8.6-50 MG PO TABS
2.0000 | ORAL_TABLET | ORAL | Status: DC
  Administered 2015-09-30: 2 via ORAL
  Filled 2015-09-30: qty 2

## 2015-09-30 MED ORDER — LIDOCAINE HCL (PF) 1 % IJ SOLN
INTRAMUSCULAR | Status: DC | PRN
  Administered 2015-09-29 (×2): 4 mL via EPIDURAL

## 2015-09-30 NOTE — Anesthesia Preprocedure Evaluation (Signed)
Anesthesia Evaluation  Patient identified by MRN, date of birth, ID band Patient awake    Reviewed: Allergy & Precautions, NPO status , Patient's Chart, lab work & pertinent test results  Airway Mallampati: I  TM Distance: >3 FB Neck ROM: Full    Dental  (+) Teeth Intact, Dental Advisory Given   Pulmonary Current Smoker,    breath sounds clear to auscultation       Cardiovascular  Rhythm:Regular Rate:Normal     Neuro/Psych    GI/Hepatic   Endo/Other    Renal/GU      Musculoskeletal   Abdominal   Peds  Hematology   Anesthesia Other Findings   Reproductive/Obstetrics (+) Pregnancy                             Anesthesia Physical Anesthesia Plan  ASA: II  Anesthesia Plan: Epidural   Post-op Pain Management:    Induction:   Airway Management Planned:   Additional Equipment:   Intra-op Plan:   Post-operative Plan:   Informed Consent: I have reviewed the patients History and Physical, chart, labs and discussed the procedure including the risks, benefits and alternatives for the proposed anesthesia with the patient or authorized representative who has indicated his/her understanding and acceptance.   Dental advisory given  Plan Discussed with: Anesthesiologist  Anesthesia Plan Comments:         Anesthesia Quick Evaluation

## 2015-09-30 NOTE — Anesthesia Postprocedure Evaluation (Signed)
Anesthesia Post Note  Patient: Jeanne Hernandez  Procedure(s) Performed: * No procedures listed *  Patient location during evaluation: Mother Baby Anesthesia Type: Epidural Level of consciousness: awake and alert Pain management: pain level controlled Vital Signs Assessment: post-procedure vital signs reviewed and stable Respiratory status: spontaneous breathing, nonlabored ventilation and respiratory function stable Cardiovascular status: stable Postop Assessment: no headache, no backache and epidural receding Anesthetic complications: no     Last Vitals:  Vitals:   09/30/15 0246 09/30/15 0301  BP: 138/82 126/65  Pulse: 68 79  Resp:    Temp:      Last Pain:  Vitals:   09/30/15 0218  TempSrc:   PainSc: 0-No pain   Pain Goal: Patients Stated Pain Goal: 5 (09/29/15 2151)               Berklie Dethlefs A

## 2015-09-30 NOTE — Progress Notes (Signed)
Patient ID: Jeanne Hernandez, female   DOB: [birth year], 25 y.o.   MRN: [id number]  Comfortable w/ epidural VSS, afeb FHR 130s, +accels, no decels Ctx q 2-3 mins, spont Cx 9/C/-1; AROM for clear fluid  IUP@term  Active labor/transition  Anticipate SVD  SHAW, KIMBERLY CNM 09/30/2015 1:06 AM

## 2015-09-30 NOTE — Anesthesia Postprocedure Evaluation (Signed)
Anesthesia Post Note  Patient: Jeanne Hernandez  Procedure(s) Performed: * No procedures listed *  Patient location during evaluation: Mother Baby Anesthesia Type: Epidural Level of consciousness: awake and alert Pain management: pain level controlled Vital Signs Assessment: post-procedure vital signs reviewed and stable Respiratory status: spontaneous breathing, nonlabored ventilation and respiratory function stable Cardiovascular status: stable Postop Assessment: no headache, no backache and epidural receding Anesthetic complications: no     Last Vitals:  Vitals:   09/30/15 0336 09/30/15 0450  BP: 127/61 (!) 114/58  Pulse: 75 81  Resp: 16 18  Temp: (!) 38.4 C 37.7 C    Last Pain:  Vitals:   09/30/15 0632  TempSrc:   PainSc: 8    Pain Goal: Patients Stated Pain Goal: 5 (09/29/15 2151)               Junious Silk

## 2015-09-30 NOTE — Anesthesia Procedure Notes (Signed)
Epidural Patient location during procedure: OB  Staffing Anesthesiologist: Lydiah Pong  Preanesthetic Checklist Completed: patient identified, site marked, surgical consent, pre-op evaluation, timeout performed, IV checked, risks and benefits discussed and monitors and equipment checked  Epidural Patient position: sitting Prep: site prepped and draped and DuraPrep Patient monitoring: continuous pulse ox and blood pressure Approach: midline Location: L3-L4 Injection technique: LOR saline  Needle:  Needle type: Tuohy  Needle gauge: 17 G Needle length: 9 cm and 9 Needle insertion depth: 5 cm cm Catheter type: closed end flexible Catheter size: 19 Gauge Catheter at skin depth: 10 cm Test dose: negative  Assessment Events: blood not aspirated, injection not painful, no injection resistance, negative IV test and no paresthesia     

## 2015-09-30 NOTE — Addendum Note (Signed)
Addendum  created 09/30/15 0804 by Junious Silk, CRNA   Charge Capture section accepted, Sign clinical note

## 2015-10-01 MED ORDER — CALCIUM CARBONATE ANTACID 500 MG PO CHEW
1.0000 | CHEWABLE_TABLET | Freq: Every day | ORAL | Status: DC | PRN
  Administered 2015-10-01: 200 mg via ORAL
  Filled 2015-10-01: qty 1

## 2015-10-01 MED ORDER — TRAMADOL HCL 50 MG PO TABS: 50.0000 mg | ORAL_TABLET | Freq: Four times a day (QID) | ORAL | Status: DC | PRN

## 2015-10-01 MED ORDER — IBUPROFEN 600 MG PO TABS: 600.0000 mg | ORAL_TABLET | Freq: Four times a day (QID) | ORAL | 0 refills | Status: DC

## 2015-10-01 NOTE — Discharge Summary (Signed)
OB Discharge Summary     Patient Name: Jeanne Hernandez DOB: 06-Aug-1990 MRN: [id number]  Date of admission: 09/29/2015 Delivering MD: Garth Bigness   Date of discharge: 10/01/2015  Admitting diagnosis: 40w ctx 3-4 min apart Intrauterine pregnancy: [redacted]w[redacted]d     Secondary diagnosis:  Active Problems:   Active labor at term  Additional problems: none     Discharge diagnosis: Term Pregnancy Delivered                                                                                                Post partum procedures:none  Augmentation: AROM  Complications: None  Hospital course:  Onset of Labor With Vaginal Delivery     25 y.o. yo [id number] at [redacted]w[redacted]d was admitted in Active Labor on 09/29/2015. Patient had an uncomplicated labor course as follows:  Membrane Rupture Time/Date: 12:54 AM ,09/30/2015   Intrapartum Procedures: Episiotomy: None [1]                                         Lacerations:  None [1]  Patient had a delivery of a Viable infant. 09/30/2015  Information for the patient's newborn:  Io, Dieujuste [[id number]]  Delivery Method: Vaginal, Spontaneous Delivery (Filed from Delivery Summary)    Pateint had an uncomplicated postpartum course.  She is ambulating, tolerating a regular diet, passing flatus, and urinating well. Patient is discharged home in stable condition on 10/01/15.    Physical exam Vitals:   09/30/15 0450 09/30/15 0820 09/30/15 1800 10/01/15 0552  BP: (!) 114/58 (!) 119/38 (!) 108/57 111/66  Pulse: 81 68 63 67  Resp: Temp: 99.8 F (37.7 C) 98 F (36.7 C) 97.8 F (36.6 C) 98.2 F (36.8 C)  TempSrc: Oral Oral  Oral  SpO2:      Weight:      Height:       General: alert, cooperative and no distress Lochia: appropriate Uterine Fundus: firm Incision: N/A DVT Evaluation: No evidence of DVT seen on physical exam. Labs: Lab Results  Component Value Date   WBC 21.3 (H) 09/29/2015   HGB 10.2 (L) 09/29/2015   HCT 29.9 (L)  09/29/2015   MCV 95.2 09/29/2015   PLT 297 09/29/2015   CMP Latest Ref Rng & Units 07/24/2011  Glucose 70 - 99 mg/dL 84  BUN 6 - 23 mg/dL 6  Creatinine 1.30 - 8.65 mg/dL 7.84  Sodium 696 - 295 mEq/L 138  Potassium 3.5 - 5.1 mEq/L 3.1(L)  Chloride 96 - 112 mEq/L 104  CO2 19 - 32 mEq/L 26  Calcium 8.4 - 10.5 mg/dL 9.3  Total Protein 6.0 - 8.3 g/dL -  Total Bilirubin 0.3 - 1.2 mg/dL -  Alkaline Phos 39 - 284 U/L -  AST 0 - 37 U/L -  ALT 0 - 35 U/L -    Discharge instruction: per After Visit Summary and "Baby and Me Booklet".  After visit meds:    Medication List    STOP  taking these medications   acetaminophen 325 MG tablet Commonly known as:  TYLENOL   ferrous sulfate 325 (65 FE) MG tablet   FLINSTONES GUMMIES OMEGA-3 DHA PO   omeprazole 20 MG capsule Commonly known as:  PRILOSEC     TAKE these medications   ibuprofen 600 MG tablet Commonly known as:  ADVIL,MOTRIN Take 1 tablet (600 mg total) by mouth every 6 (six) hours.       Diet: routine diet  Activity: Advance as tolerated. Pelvic rest for 6 weeks.   Outpatient follow up: Follow up Appt:Future Appointments Date Time Provider Department Center  11/10/2015 3:00 PM Cheral Marker, CNM [facility]   Follow up Visit:No Follow-up on file.  Postpartum contraception: Nexplanon  Newborn Data: Live born female  Birth Weight: 7 lb 8.1 oz (3405 g) APGAR: 8, 9  Baby Feeding: Bottle Disposition:home with mother   10/01/2015 Greig Right, CNM

## 2015-10-01 NOTE — Discharge Instructions (Signed)
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
## 2015-10-07 ENCOUNTER — Telehealth: Payer: Self-pay | Admitting: *Deleted

## 2015-10-07 NOTE — Telephone Encounter (Signed)
Jeanne Hernandez from the [facility] state pt delivered SVD 0n 09/30/2015, Hgb today 10.2.

## 2015-11-10 ENCOUNTER — Ambulatory Visit: Payer: Medicaid Other | Admitting: Women's Health

## 2017-10-15 DIAGNOSIS — Z308 Encounter for other contraceptive management: Secondary | ICD-10-CM | POA: Diagnosis not present

## 2017-10-15 DIAGNOSIS — Z0000 Encounter for general adult medical examination without abnormal findings: Secondary | ICD-10-CM | POA: Diagnosis not present

## 2017-10-16 DIAGNOSIS — Z3042 Encounter for surveillance of injectable contraceptive: Secondary | ICD-10-CM | POA: Diagnosis not present

## 2017-12-07 ENCOUNTER — Ambulatory Visit (INDEPENDENT_AMBULATORY_CARE_PROVIDER_SITE_OTHER): Payer: Self-pay | Admitting: *Deleted

## 2017-12-07 DIAGNOSIS — Z111 Encounter for screening for respiratory tuberculosis: Secondary | ICD-10-CM

## 2017-12-07 NOTE — Progress Notes (Signed)
Not a pt here, appt made by LAT - she is self pay for TB test. Tolerated well and will come by [facility] 12/10/17 for Tb read.

## 2017-12-10 LAB — TB SKIN TEST
Induration: 0 mm
TB Skin Test: NEGATIVE

## 2018-10-09 ENCOUNTER — Telehealth: Payer: Self-pay

## 2018-10-09 NOTE — Telephone Encounter (Signed)
Pt shot record is ready for pickup

## 2018-10-24 ENCOUNTER — Ambulatory Visit: Payer: Medicaid Other | Admitting: Family Medicine

## 2018-12-25 ENCOUNTER — Telehealth: Payer: Self-pay | Admitting: Family Medicine

## 2018-12-25 ENCOUNTER — Other Ambulatory Visit: Payer: Self-pay

## 2018-12-26 ENCOUNTER — Ambulatory Visit (INDEPENDENT_AMBULATORY_CARE_PROVIDER_SITE_OTHER): Payer: Self-pay | Admitting: Family Medicine

## 2018-12-26 ENCOUNTER — Encounter: Payer: Self-pay | Admitting: Family Medicine

## 2018-12-26 DIAGNOSIS — F339 Major depressive disorder, recurrent, unspecified: Secondary | ICD-10-CM

## 2018-12-26 MED ORDER — SERTRALINE HCL 50 MG PO TABS: 50.0000 mg | ORAL_TABLET | Freq: Every day | ORAL | 5 refills | Status: DC
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
## 2019-01-22 ENCOUNTER — Ambulatory Visit: Payer: Medicaid Other | Admitting: Family Medicine

## 2019-01-23 ENCOUNTER — Encounter: Payer: Self-pay | Admitting: Family Medicine

## 2019-01-24 ENCOUNTER — Telehealth: Payer: Self-pay | Admitting: Family Medicine

## 2019-01-24 NOTE — Telephone Encounter (Signed)
Contacted patient, appointment made for televisit on 01/27/2019 at 4:10 pm with Dr. Warrick Parisian.

## 2019-01-27 ENCOUNTER — Encounter: Payer: Self-pay | Admitting: Family Medicine

## 2019-01-27 ENCOUNTER — Ambulatory Visit (INDEPENDENT_AMBULATORY_CARE_PROVIDER_SITE_OTHER): Payer: Medicaid Other | Admitting: Family Medicine

## 2019-01-27 DIAGNOSIS — F339 Major depressive disorder, recurrent, unspecified: Secondary | ICD-10-CM

## 2019-01-27 MED ORDER — SERTRALINE HCL 100 MG PO TABS: 100.0000 mg | ORAL_TABLET | Freq: Every day | ORAL | 1 refills | Status: DC
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
## 2019-02-10 ENCOUNTER — Telehealth: Payer: Self-pay | Admitting: Family Medicine

## 2019-02-10 NOTE — Telephone Encounter (Signed)
Patient says she knows she has an appt to see Dr Dettinger next week but says she just found out that she was pregnant and wants to know if she should continue taking Zoloft.

## 2019-02-10 NOTE — Telephone Encounter (Signed)
Dr. Warrick Parisian consulted and gave verbal order to continue Zoloft. LMTCB 112/07/20

## 2019-02-13 ENCOUNTER — Encounter: Payer: Self-pay | Admitting: Adult Health

## 2019-02-13 ENCOUNTER — Other Ambulatory Visit: Payer: Self-pay

## 2019-02-13 ENCOUNTER — Ambulatory Visit (INDEPENDENT_AMBULATORY_CARE_PROVIDER_SITE_OTHER): Payer: Medicaid Other | Admitting: Adult Health

## 2019-02-13 VITALS — BP 102/68 | HR 91 | Ht 62.0 in | Wt 100.0 lb

## 2019-02-13 DIAGNOSIS — Z3A01 Less than 8 weeks gestation of pregnancy: Secondary | ICD-10-CM | POA: Diagnosis not present

## 2019-02-13 DIAGNOSIS — O3680X0 Pregnancy with inconclusive fetal viability, not applicable or unspecified: Secondary | ICD-10-CM | POA: Diagnosis not present

## 2019-02-13 DIAGNOSIS — Z3201 Encounter for pregnancy test, result positive: Secondary | ICD-10-CM

## 2019-02-13 LAB — POCT URINE PREGNANCY: Preg Test, Ur: POSITIVE — AB

## 2019-02-13 MED ORDER — PROMETHAZINE HCL 25 MG PO TABS: 25.0000 mg | ORAL_TABLET | Freq: Four times a day (QID) | ORAL | 1 refills | Status: DC | PRN

## 2019-02-13 NOTE — Progress Notes (Signed)
  Subjective:     Patient ID: Jeanne Hernandez, female   DOB: 06/25/1990, 28 y.o.   MRN: [id number]  HPI Jeanne Hernandez is a 28 years old white female, single, in for UPT, has missed period and had 2 +HPTs.She is [id number].  PCP is Dr Warrick Parisian.  Review of Systems +missed period, with 2 +HPTs +nausea  Reviewed past medical,surgical, social and family history. Reviewed medications and allergies.     Objective:   Physical Exam BP 102/68 (BP Location: Left Arm, Patient Position: Sitting, Cuff Size: Normal)   Pulse 91   Ht 5\' 2"  (1.575 m)   Wt 100 lb (45.4 kg)   LMP 12/25/2018 (Within Weeks)   Breastfeeding No   BMI 18.29 kg/m UPT +, about 7+1 week by LMP with EDD 10/01/19 Skin warm and dry. Neck: mid line trachea, normal thyroid, good ROM, no lymphadenopathy noted. Lungs: clear to ausculation bilaterally. Cardiovascular: regular rate and rhythm. Abdomen is soft and non tender Fall  risk is low PHQ 2 score 0.  Exam by Weyman Croon FNP student.    Assessment:    1. Positive pregnancy test Continue PNV  2. Less than [redacted] weeks gestation of pregnancy Eat often Meds ordered this encounter  Medications  . promethazine (PHENERGAN) 25 MG tablet    Sig: Take 1 tablet (25 mg total) by mouth every 6 (six) hours as needed for nausea or vomiting.    Dispense:  30 tablet    Refill:  1    Order Specific Question:   Supervising Provider    Answer:   Elonda Husky, LUTHER H [[id number]]  Continue to decrease smoking   3. Encounter to determine fetal viability of pregnancy, single or unspecified fetus Dating [place] about 12/28      Plan:     Review handout by [facility]

## 2019-02-19 ENCOUNTER — Encounter: Payer: Self-pay | Admitting: Family Medicine

## 2019-02-19 ENCOUNTER — Other Ambulatory Visit: Payer: Self-pay

## 2019-02-19 ENCOUNTER — Ambulatory Visit (INDEPENDENT_AMBULATORY_CARE_PROVIDER_SITE_OTHER): Payer: Medicaid Other | Admitting: Family Medicine

## 2019-02-19 DIAGNOSIS — F339 Major depressive disorder, recurrent, unspecified: Secondary | ICD-10-CM | POA: Diagnosis not present

## 2019-02-19 MED ORDER — SERTRALINE HCL 100 MG PO TABS: 100.0000 mg | ORAL_TABLET | Freq: Every day | ORAL | 5 refills | Status: DC

## 2019-02-19 NOTE — Progress Notes (Signed)
Virtual Visit via telephone Note  I connected with Jeanne Hernandez on 02/19/19 at 1447 by telephone and verified that I am speaking with the correct person using two identifiers. Jeanne Hernandez is currently located at home and no other people are currently with her during visit. The provider, Fransisca Kaufmann Caymen Dubray, MD is located in their office at time of visit.  Call ended at 1459  I discussed the limitations, risks, security and privacy concerns of performing an evaluation and management service by telephone and the availability of in person appointments. I also discussed with the patient that there may be a patient responsible charge related to this service. The patient expressed understanding and agreed to proceed.   History and Present Illness: Patient is calling in for a recheck on anxiety and depression today and she is feeling a lot more motivated and energy doing things, she is not overwhelmed with things.  She denies side effects.  She denies any issues with kids and she is getting up with kids and getting them caught up. She is sleeping well at night.  She is currently about [redacted] weeks pregnant.      Outpatient Encounter Medications as of 02/19/2019  Medication Sig  . Multiple Vitamin (MULTIVITAMIN) tablet Take 1 tablet by mouth daily.  . promethazine (PHENERGAN) 25 MG tablet Take 1 tablet (25 mg total) by mouth every 6 (six) hours as needed for nausea or vomiting.  . sertraline (ZOLOFT) 100 MG tablet Take 1 tablet (100 mg total) by mouth daily.  Marland Kitchen UNABLE TO FIND Med Name: Focus factor  . [DISCONTINUED] sertraline (ZOLOFT) 100 MG tablet Take 1 tablet (100 mg total) by mouth daily.   No facility-administered encounter medications on file as of 02/19/2019.    Review of Systems  Constitutional: Negative for chills and fever.  Eyes: Negative for visual disturbance.  Respiratory: Negative for chest tightness and shortness of breath.   Cardiovascular: Negative for chest pain and leg  swelling.  Musculoskeletal: Negative for back pain and gait problem.  Skin: Negative for rash.  Neurological: Negative for light-headedness and headaches.  Psychiatric/Behavioral: Positive for dysphoric mood. Negative for agitation, behavioral problems, self-injury, sleep disturbance and suicidal ideas. The patient is nervous/anxious.   All other systems reviewed and are negative.   Observations/Objective: Patient sounds comfortable and in no acute distress  Assessment and Plan: Problem List Items Addressed This Visit      Other   Depression, recurrent ([place]) - Primary   Relevant Medications   sertraline (ZOLOFT) 100 MG tablet      Patient seems to be doing well on the Zoloft and we will continue her current dose. Follow up plan: Return in about 2 months (around 04/22/2019), or if symptoms worsen or fail to improve, for depression.     I discussed the assessment and treatment plan with the patient. The patient was provided an opportunity to ask questions and all were answered. The patient agreed with the plan and demonstrated an understanding of the instructions.   The patient was advised to call back or seek an in-person evaluation if the symptoms worsen or if the condition fails to improve as anticipated.  The above assessment and management plan was discussed with the patient. The patient verbalized understanding of and has agreed to the management plan. Patient is aware to call the clinic if symptoms persist or worsen. Patient is aware when to return to the clinic for a follow-up visit. Patient educated on when it is appropriate to go  to the emergency department.    I provided 12 minutes of non-face-to-face time during this encounter.    Nils Pyle, MD

## 2019-03-03 ENCOUNTER — Other Ambulatory Visit: Payer: Medicaid Other

## 2019-03-20 ENCOUNTER — Ambulatory Visit (INDEPENDENT_AMBULATORY_CARE_PROVIDER_SITE_OTHER): Payer: Medicaid Other

## 2019-03-20 ENCOUNTER — Other Ambulatory Visit: Payer: Self-pay

## 2019-03-20 DIAGNOSIS — O3680X0 Pregnancy with inconclusive fetal viability, not applicable or unspecified: Secondary | ICD-10-CM | POA: Diagnosis not present

## 2019-03-20 DIAGNOSIS — Z3A12 12 weeks gestation of pregnancy: Secondary | ICD-10-CM | POA: Diagnosis not present

## 2019-03-20 NOTE — Progress Notes (Signed)
[place] 10+6 wks,single IUP w/ys,positive fht 171 bpm,normal ovaries,crl 39.21 mm

## 2019-04-02 ENCOUNTER — Other Ambulatory Visit: Payer: Self-pay | Admitting: Obstetrics and Gynecology

## 2019-04-02 DIAGNOSIS — Z3682 Encounter for antenatal screening for nuchal translucency: Secondary | ICD-10-CM

## 2019-04-03 ENCOUNTER — Encounter: Payer: Medicaid Other | Admitting: Advanced Practice Midwife

## 2019-04-03 ENCOUNTER — Ambulatory Visit: Payer: Medicaid Other | Admitting: *Deleted

## 2019-04-03 ENCOUNTER — Other Ambulatory Visit: Payer: Medicaid Other

## 2019-04-17 ENCOUNTER — Ambulatory Visit: Payer: Medicaid Other | Admitting: *Deleted

## 2019-04-17 ENCOUNTER — Encounter: Payer: Medicaid Other | Admitting: Advanced Practice Midwife

## 2019-04-23 ENCOUNTER — Ambulatory Visit: Payer: Self-pay | Admitting: Family Medicine

## 2019-04-30 ENCOUNTER — Encounter: Payer: Self-pay | Admitting: Family Medicine

## 2019-05-07 ENCOUNTER — Encounter: Payer: Medicaid Other | Admitting: Advanced Practice Midwife

## 2019-05-07 ENCOUNTER — Ambulatory Visit: Payer: Medicaid Other | Admitting: *Deleted

## 2019-05-12 ENCOUNTER — Ambulatory Visit: Payer: Medicaid Other | Admitting: Family Medicine

## 2019-05-13 ENCOUNTER — Telehealth: Payer: Self-pay | Admitting: Family Medicine

## 2019-05-13 ENCOUNTER — Encounter: Payer: Self-pay | Admitting: Family Medicine

## 2019-05-13 NOTE — Telephone Encounter (Signed)
Okay look forward to our visit

## 2019-05-22 ENCOUNTER — Ambulatory Visit (INDEPENDENT_AMBULATORY_CARE_PROVIDER_SITE_OTHER): Payer: Medicaid Other | Admitting: Family Medicine

## 2019-05-22 ENCOUNTER — Encounter: Payer: Self-pay | Admitting: Family Medicine

## 2019-05-22 DIAGNOSIS — F339 Major depressive disorder, recurrent, unspecified: Secondary | ICD-10-CM

## 2019-05-22 MED ORDER — SERTRALINE HCL 100 MG PO TABS: 150.0000 mg | ORAL_TABLET | Freq: Every day | ORAL | 3 refills | Status: DC

## 2019-05-22 NOTE — Progress Notes (Signed)
   Virtual Visit via telephone Note  I connected with Jeanne Hernandez on 05/22/19 at 1453 by telephone and verified that I am speaking with the correct person using two identifiers. Jeanne Hernandez is currently located at home and no other people are currently with her during visit. The provider, Elige Radon Ennifer Harston, MD is located in their office at time of visit.  Call ended at 1508  I discussed the limitations, risks, security and privacy concerns of performing an evaluation and management service by telephone and the availability of in person appointments. I also discussed with the patient that there may be a patient responsible charge related to this service. The patient expressed understanding and agreed to proceed.   History and Present Illness: Depression recheck Patient is calling in for depression recheck, she is currently taking zoloft. She is doing college and doing ok in grades. She denies any suicidal ideations or thoughts of hurting himself. She feels like it stopped working.  She is not wanting to get up.  She has low energy and naps.   No diagnosis found.  Outpatient Encounter Medications as of 05/22/2019  Medication Sig  . Multiple Vitamin (MULTIVITAMIN) tablet Take 1 tablet by mouth daily.  . promethazine (PHENERGAN) 25 MG tablet Take 1 tablet (25 mg total) by mouth every 6 (six) hours as needed for nausea or vomiting.  . sertraline (ZOLOFT) 100 MG tablet Take 1 tablet (100 mg total) by mouth daily.  Marland Kitchen UNABLE TO FIND Med Name: Focus factor   No facility-administered encounter medications on file as of 05/22/2019.    Review of Systems  Constitutional: Negative for chills and fever.  Eyes: Negative for visual disturbance.  Respiratory: Negative for chest tightness and shortness of breath.   Cardiovascular: Negative for chest pain and leg swelling.  Musculoskeletal: Negative for back pain and gait problem.  Skin: Negative for rash.  Neurological: Negative for  light-headedness and headaches.  Psychiatric/Behavioral: Positive for dysphoric mood and sleep disturbance. Negative for agitation, behavioral problems, self-injury and suicidal ideas. The patient is nervous/anxious.   All other systems reviewed and are negative.   Observations/Objective: Patient sounds comfortable and in no acute distress  Assessment and Plan: Problem List Items Addressed This Visit      Other   Depression, recurrent (HCC) - Primary   Relevant Medications   sertraline (ZOLOFT) 100 MG tablet      Increase to 150mg  zoloft Follow up plan: Return in about 5 weeks (around 06/26/2019), or if symptoms worsen or fail to improve, for recheck depression.     I discussed the assessment and treatment plan with the patient. The patient was provided an opportunity to ask questions and all were answered. The patient agreed with the plan and demonstrated an understanding of the instructions.   The patient was advised to call back or seek an in-person evaluation if the symptoms worsen or if the condition fails to improve as anticipated.  The above assessment and management plan was discussed with the patient. The patient verbalized understanding of and has agreed to the management plan. Patient is aware to call the clinic if symptoms persist or worsen. Patient is aware when to return to the clinic for a follow-up visit. Patient educated on when it is appropriate to go to the emergency department.    I provided 15 minutes of non-face-to-face time during this encounter.    06/28/2019, MD

## 2019-06-27 ENCOUNTER — Other Ambulatory Visit: Payer: Self-pay | Admitting: Adult Health

## 2019-07-17 DIAGNOSIS — R05 Cough: Secondary | ICD-10-CM | POA: Diagnosis not present

## 2019-07-17 DIAGNOSIS — R5383 Other fatigue: Secondary | ICD-10-CM | POA: Diagnosis not present

## 2019-07-17 DIAGNOSIS — R0981 Nasal congestion: Secondary | ICD-10-CM | POA: Diagnosis not present

## 2019-08-06 DIAGNOSIS — Z3A30 30 weeks gestation of pregnancy: Secondary | ICD-10-CM | POA: Diagnosis not present

## 2019-08-06 DIAGNOSIS — Z3689 Encounter for other specified antenatal screening: Secondary | ICD-10-CM | POA: Diagnosis not present

## 2019-09-17 DIAGNOSIS — Z3689 Encounter for other specified antenatal screening: Secondary | ICD-10-CM | POA: Diagnosis not present

## 2019-09-17 DIAGNOSIS — O0933 Supervision of pregnancy with insufficient antenatal care, third trimester: Secondary | ICD-10-CM | POA: Diagnosis not present

## 2019-09-17 DIAGNOSIS — Z3A36 36 weeks gestation of pregnancy: Secondary | ICD-10-CM | POA: Diagnosis not present

## 2019-10-01 DIAGNOSIS — O479 False labor, unspecified: Secondary | ICD-10-CM | POA: Diagnosis not present

## 2019-10-01 DIAGNOSIS — Z3A00 Weeks of gestation of pregnancy not specified: Secondary | ICD-10-CM | POA: Diagnosis not present

## 2019-10-10 DIAGNOSIS — O9902 Anemia complicating childbirth: Secondary | ICD-10-CM | POA: Diagnosis not present

## 2019-10-10 DIAGNOSIS — O99343 Other mental disorders complicating pregnancy, third trimester: Secondary | ICD-10-CM | POA: Diagnosis not present

## 2019-10-10 DIAGNOSIS — F329 Major depressive disorder, single episode, unspecified: Secondary | ICD-10-CM | POA: Diagnosis not present

## 2019-10-10 DIAGNOSIS — Z20822 Contact with and (suspected) exposure to covid-19: Secondary | ICD-10-CM | POA: Diagnosis not present

## 2019-10-10 DIAGNOSIS — O99344 Other mental disorders complicating childbirth: Secondary | ICD-10-CM | POA: Diagnosis not present

## 2019-10-10 DIAGNOSIS — Z3A40 40 weeks gestation of pregnancy: Secondary | ICD-10-CM | POA: Diagnosis not present

## 2019-11-17 ENCOUNTER — Ambulatory Visit: Payer: Medicaid Other | Admitting: Family Medicine

## 2019-12-29 ENCOUNTER — Encounter: Payer: Self-pay | Admitting: Family Medicine

## 2019-12-29 ENCOUNTER — Ambulatory Visit (INDEPENDENT_AMBULATORY_CARE_PROVIDER_SITE_OTHER): Payer: Medicaid Other | Admitting: Family Medicine

## 2019-12-29 DIAGNOSIS — F339 Major depressive disorder, recurrent, unspecified: Secondary | ICD-10-CM | POA: Diagnosis not present

## 2019-12-29 MED ORDER — BUPROPION HCL ER (XL) 150 MG PO TB24: 150.0000 mg | ORAL_TABLET | Freq: Every day | ORAL | 2 refills | Status: AC

## 2019-12-29 NOTE — Progress Notes (Signed)
Virtual Visit via telephone Note  I connected with Jeanne Hernandez on 12/29/19 at 1325 by telephone and verified that I am speaking with the correct person using two identifiers. Jeanne Hernandez is currently located at home and patient are currently with her during visit. The provider, Elige Radon Gini Caputo, MD is located in their office at time of visit.  Call ended at 1348  I discussed the limitations, risks, security and privacy concerns of performing an evaluation and management service by telephone and the availability of in person appointments. I also discussed with the patient that there may be a patient responsible charge related to this service. The patient expressed understanding and agreed to proceed.   History and Present Illness: Patient is calling in for recheck on depression and anxiety and had increased Zoloft to 100 mg and she still feels like not helping.  She feels like things are tedious, she is not motivated and not feeling like her old self.  She feels guilty about being a parent and had to take a break from college. She feels like she is running all over the place. She feels like she is not enjoying life or parenting. She has increased anxiety and panicky. Her newborn is on a different sleeping schedule. They have not found a good place to rent right now and are trying to find a bigger place. She is in a 1 room apartment. Her relationship with her husband is strained and he does not always help with the kids. She has no friends to talk to. She has low self esteem.   No diagnosis found.  Outpatient Encounter Medications as of 12/29/2019  Medication Sig  . Multiple Vitamin (MULTIVITAMIN) tablet Take 1 tablet by mouth daily.  . sertraline (ZOLOFT) 100 MG tablet Take 1.5 tablets (150 mg total) by mouth daily.  Marland Kitchen UNABLE TO FIND Med Name: Focus factor   No facility-administered encounter medications on file as of 12/29/2019.    Review of Systems  Constitutional: Negative for  chills and fever.  Eyes: Negative for visual disturbance.  Respiratory: Negative for chest tightness and shortness of breath.   Cardiovascular: Negative for chest pain and leg swelling.  Skin: Negative for rash.  Neurological: Negative for light-headedness and headaches.  Psychiatric/Behavioral: Positive for dysphoric mood and sleep disturbance. Negative for agitation, behavioral problems, self-injury and suicidal ideas. The patient is nervous/anxious.   All other systems reviewed and are negative.   Observations/Objective: Patient sounds comfortable and in no acute distress  Assessment and Plan: Problem List Items Addressed This Visit      Other   Depression, recurrent (HCC) - Primary   Relevant Medications   buPROPion (WELLBUTRIN XL) 150 MG 24 hr tablet    Will add Wellbutrin to the patient, keep the Zoloft, see back in 3 to 4 weeks.  Follow up plan: Return in about 4 weeks (around 01/26/2020), or if symptoms worsen or fail to improve, for depression.     I discussed the assessment and treatment plan with the patient. The patient was provided an opportunity to ask questions and all were answered. The patient agreed with the plan and demonstrated an understanding of the instructions.   The patient was advised to call back or seek an in-person evaluation if the symptoms worsen or if the condition fails to improve as anticipated.  The above assessment and management plan was discussed with the patient. The patient verbalized understanding of and has agreed to the management plan. Patient is aware to call  the clinic if symptoms persist or worsen. Patient is aware when to return to the clinic for a follow-up visit. Patient educated on when it is appropriate to go to the emergency department.    I provided 13 minutes of non-face-to-face time during this encounter.    Nils Pyle, MD

## 2020-04-30 ENCOUNTER — Other Ambulatory Visit: Payer: Self-pay | Admitting: Family Medicine

## 2020-04-30 DIAGNOSIS — F339 Major depressive disorder, recurrent, unspecified: Secondary | ICD-10-CM

## 2020-07-06 ENCOUNTER — Other Ambulatory Visit: Payer: Self-pay | Admitting: Family Medicine

## 2020-07-06 DIAGNOSIS — F339 Major depressive disorder, recurrent, unspecified: Secondary | ICD-10-CM

## 2020-07-07 NOTE — Telephone Encounter (Signed)
[place]. NTBS 30 days given 04/30/20

## 2020-07-13 DIAGNOSIS — H6980 Other specified disorders of Eustachian tube, unspecified ear: Secondary | ICD-10-CM | POA: Diagnosis not present

## 2020-07-28 ENCOUNTER — Ambulatory Visit: Payer: Medicaid Other | Admitting: Family Medicine

## 2020-07-30 ENCOUNTER — Encounter: Payer: Self-pay | Admitting: Family Medicine

## 2020-12-13 DIAGNOSIS — Z369 Encounter for antenatal screening, unspecified: Secondary | ICD-10-CM | POA: Diagnosis not present

## 2020-12-13 DIAGNOSIS — O0933 Supervision of pregnancy with insufficient antenatal care, third trimester: Secondary | ICD-10-CM | POA: Diagnosis not present

## 2020-12-13 DIAGNOSIS — Z3201 Encounter for pregnancy test, result positive: Secondary | ICD-10-CM | POA: Diagnosis not present

## 2020-12-13 DIAGNOSIS — Z3A34 34 weeks gestation of pregnancy: Secondary | ICD-10-CM | POA: Diagnosis not present

## 2020-12-13 DIAGNOSIS — O99333 Smoking (tobacco) complicating pregnancy, third trimester: Secondary | ICD-10-CM | POA: Diagnosis not present

## 2020-12-13 DIAGNOSIS — Z3689 Encounter for other specified antenatal screening: Secondary | ICD-10-CM | POA: Diagnosis not present

## 2020-12-13 DIAGNOSIS — O09893 Supervision of other high risk pregnancies, third trimester: Secondary | ICD-10-CM | POA: Diagnosis not present

## 2020-12-13 DIAGNOSIS — N912 Amenorrhea, unspecified: Secondary | ICD-10-CM | POA: Diagnosis not present

## 2020-12-29 DIAGNOSIS — Z3689 Encounter for other specified antenatal screening: Secondary | ICD-10-CM | POA: Diagnosis not present

## 2020-12-29 DIAGNOSIS — Z23 Encounter for immunization: Secondary | ICD-10-CM | POA: Diagnosis not present

## 2021-01-11 DIAGNOSIS — H5213 Myopia, bilateral: Secondary | ICD-10-CM | POA: Diagnosis not present

## 2021-01-13 DIAGNOSIS — F319 Bipolar disorder, unspecified: Secondary | ICD-10-CM | POA: Diagnosis not present

## 2021-01-13 DIAGNOSIS — Z20822 Contact with and (suspected) exposure to covid-19: Secondary | ICD-10-CM | POA: Diagnosis not present

## 2021-01-13 DIAGNOSIS — O99344 Other mental disorders complicating childbirth: Secondary | ICD-10-CM | POA: Diagnosis not present

## 2021-01-13 DIAGNOSIS — O0943 Supervision of pregnancy with grand multiparity, third trimester: Secondary | ICD-10-CM | POA: Diagnosis not present

## 2021-01-13 DIAGNOSIS — Z3A38 38 weeks gestation of pregnancy: Secondary | ICD-10-CM | POA: Diagnosis not present

## 2021-01-14 DIAGNOSIS — Z302 Encounter for sterilization: Secondary | ICD-10-CM | POA: Diagnosis not present

## 2021-01-17 ENCOUNTER — Telehealth: Payer: Self-pay

## 2021-01-17 ENCOUNTER — Telehealth: Payer: Self-pay | Admitting: Family Medicine

## 2021-01-17 NOTE — Telephone Encounter (Signed)
Transition Care Management Unsuccessful Follow-up Telephone Call  Date of discharge and from where:  01/14/2021[facility]  Attempts:  1st Attempt  Reason for unsuccessful TCM follow-up call:  Left voice message

## 2021-01-18 NOTE — Telephone Encounter (Signed)
Transition Care Management Unsuccessful Follow-up Telephone Call  Date of discharge and from where:  01/14/2021 from [facility]  Attempts:  2nd Attempt  Reason for unsuccessful TCM follow-up call:  Left voice message   .

## 2021-01-18 NOTE — Telephone Encounter (Signed)
Letter has been printed and placed up front. Left message informing pt that it was ready.

## 2021-01-19 NOTE — Telephone Encounter (Signed)
Transition Care Management Unsuccessful Follow-up Telephone Call  Date of discharge and from where:  01/14/2021 from [facility]  Attempts:  3rd Attempt  Reason for unsuccessful TCM follow-up call:  Unable to reach patient

## 2021-02-04 DIAGNOSIS — H52223 Regular astigmatism, bilateral: Secondary | ICD-10-CM | POA: Diagnosis not present

## 2021-02-04 DIAGNOSIS — H5203 Hypermetropia, bilateral: Secondary | ICD-10-CM | POA: Diagnosis not present

## 2021-04-25 ENCOUNTER — Telehealth: Payer: Self-pay | Admitting: Family Medicine

## 2021-04-25 NOTE — Telephone Encounter (Signed)
Patient came in to pick up letter that was written on 01/18/21 and she said that she needed it to be more descriptive. She asked if we could add that it took a few months of appointments and that her dosage of medication was changed multiple times before they reach one that would work for her. Please call patient when the letter is done or if there are any questions.

## 2021-04-27 NOTE — Telephone Encounter (Signed)
Yes that is fine to do a letter for her stating these things

## 2021-04-27 NOTE — Telephone Encounter (Signed)
Letter has been printed and placed up front. Called pt. No answer and vmail is full.

## 2024-01-03 ENCOUNTER — Ambulatory Visit: Admitting: Family Medicine
